# Patient Record
Sex: Male | Born: 1975 | Race: Black or African American | Hispanic: No | Marital: Married | Smoking: Former smoker
Health system: Southern US, Community
[De-identification: ages and names within clinical notes are randomized; demographics above are authoritative.]

## PROBLEM LIST (undated history)

## (undated) DIAGNOSIS — D696 Thrombocytopenia, unspecified: Secondary | ICD-10-CM

## (undated) DIAGNOSIS — F988 Other specified behavioral and emotional disorders with onset usually occurring in childhood and adolescence: Secondary | ICD-10-CM

## (undated) DIAGNOSIS — K219 Gastro-esophageal reflux disease without esophagitis: Secondary | ICD-10-CM

## (undated) DIAGNOSIS — E559 Vitamin D deficiency, unspecified: Secondary | ICD-10-CM

## (undated) DIAGNOSIS — I10 Essential (primary) hypertension: Secondary | ICD-10-CM

## (undated) HISTORY — DX: Gastro-esophageal reflux disease without esophagitis: K21.9

## (undated) HISTORY — DX: Essential (primary) hypertension: I10

## (undated) HISTORY — DX: Other specified behavioral and emotional disorders with onset usually occurring in childhood and adolescence: F98.8

## (undated) HISTORY — DX: Vitamin D deficiency, unspecified: E55.9

## (undated) HISTORY — PX: OTHER SURGICAL HISTORY: SHX169

---

## 1996-06-16 HISTORY — PX: KNEE ARTHROSCOPY: SUR90

## 2006-05-23 ENCOUNTER — Ambulatory Visit: Payer: Self-pay

## 2009-01-29 ENCOUNTER — Ambulatory Visit: Payer: Self-pay | Admitting: Sports Medicine

## 2013-10-02 ENCOUNTER — Emergency Department: Payer: Self-pay | Admitting: Emergency Medicine

## 2013-11-08 DIAGNOSIS — S62143A Displaced fracture of body of hamate [unciform] bone, unspecified wrist, initial encounter for closed fracture: Secondary | ICD-10-CM | POA: Insufficient documentation

## 2013-11-08 DIAGNOSIS — S62338A Displaced fracture of neck of other metacarpal bone, initial encounter for closed fracture: Secondary | ICD-10-CM | POA: Insufficient documentation

## 2013-11-08 DIAGNOSIS — S63096A Other dislocation of unspecified wrist and hand, initial encounter: Secondary | ICD-10-CM | POA: Insufficient documentation

## 2013-11-08 DIAGNOSIS — S62341A Nondisplaced fracture of base of second metacarpal bone. left hand, initial encounter for closed fracture: Secondary | ICD-10-CM | POA: Insufficient documentation

## 2014-09-09 IMAGING — CR RIGHT HAND - COMPLETE 3+ VIEW
1 series · 4 of 4 positions shown · non-contrast
Comparison: None.

CLINICAL DATA: Pain after hitting a wall.  Hand is swollen

EXAM:
RIGHT HAND - COMPLETE 3+ VIEW

[Series 1: oblique · 0.17mm/px · 4 of 4 slices shown]
[im 1/4]
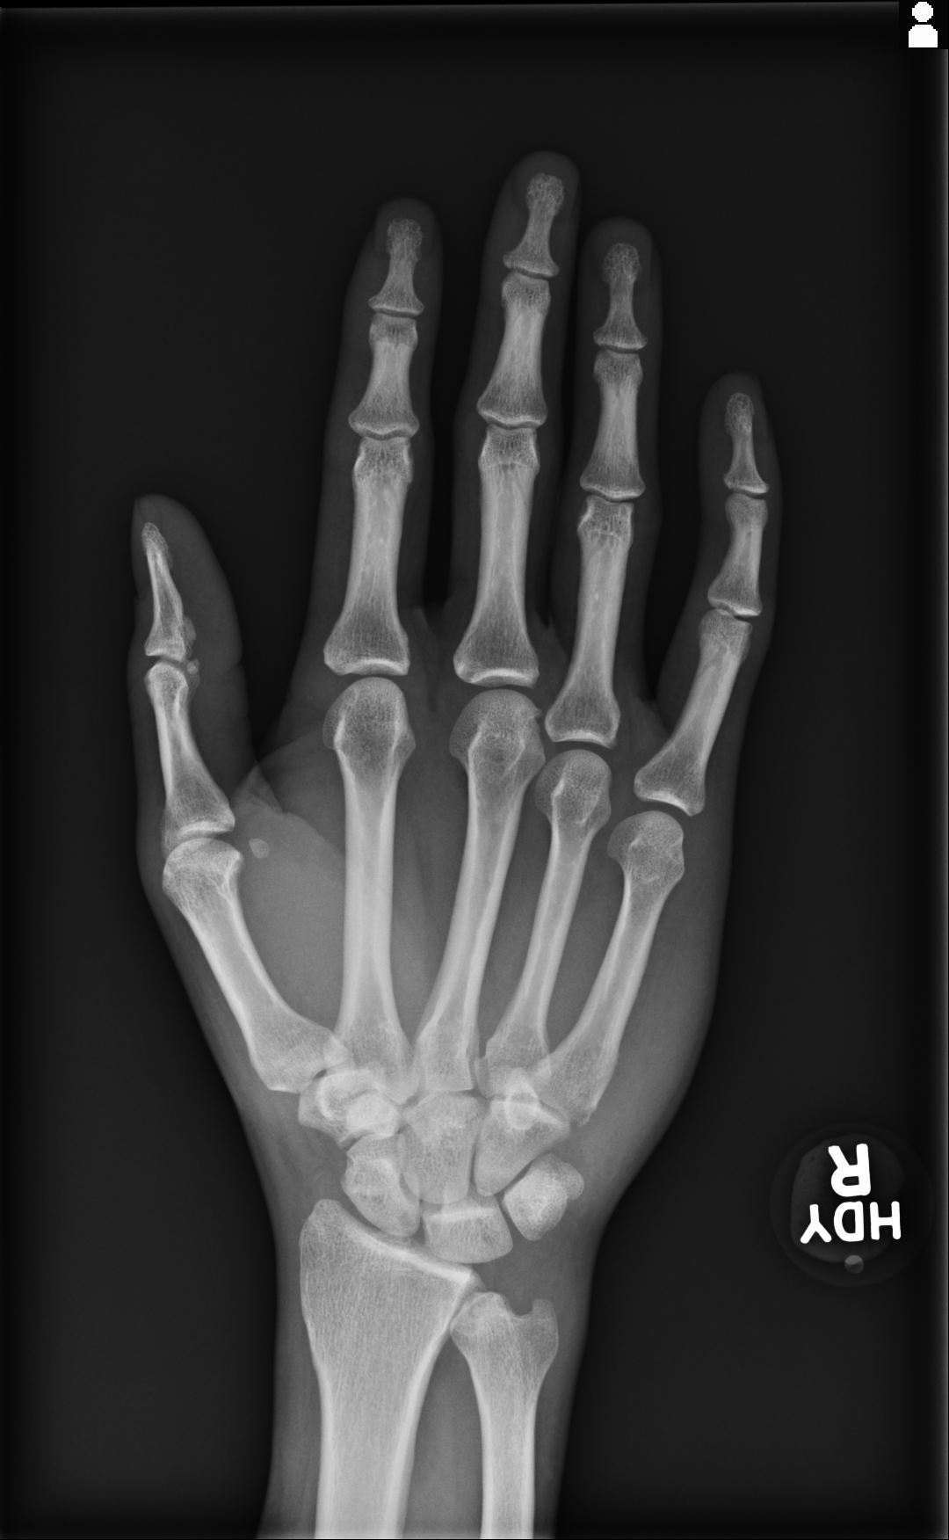
[im 2/4]
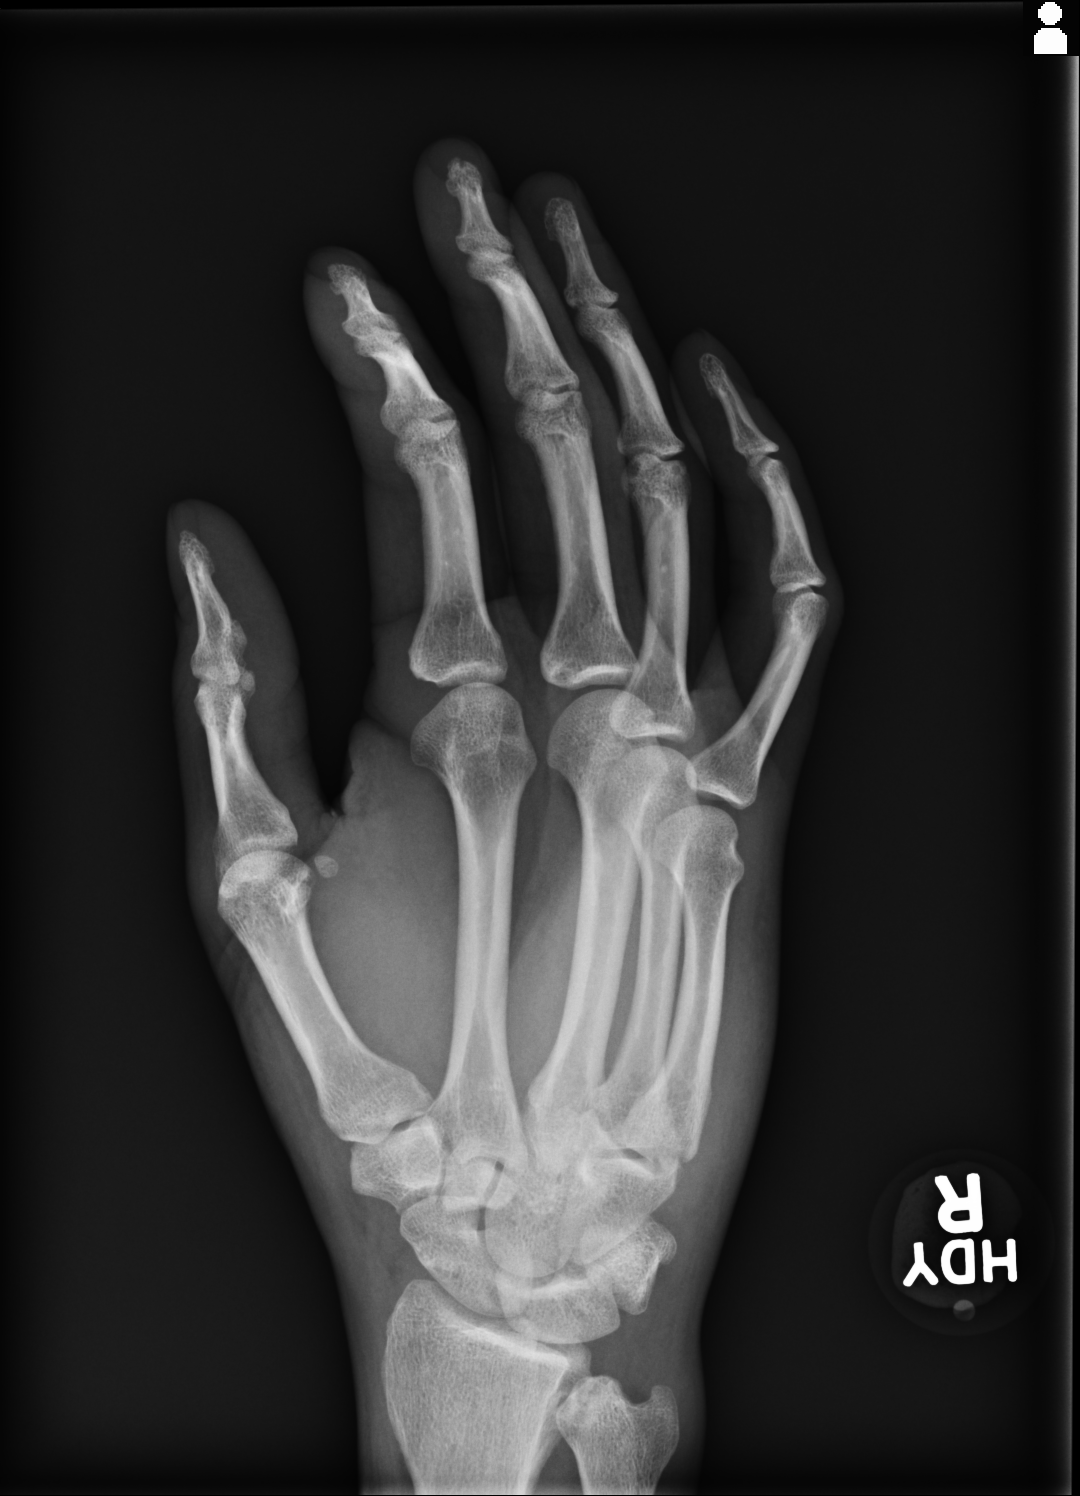
[im 3/4]
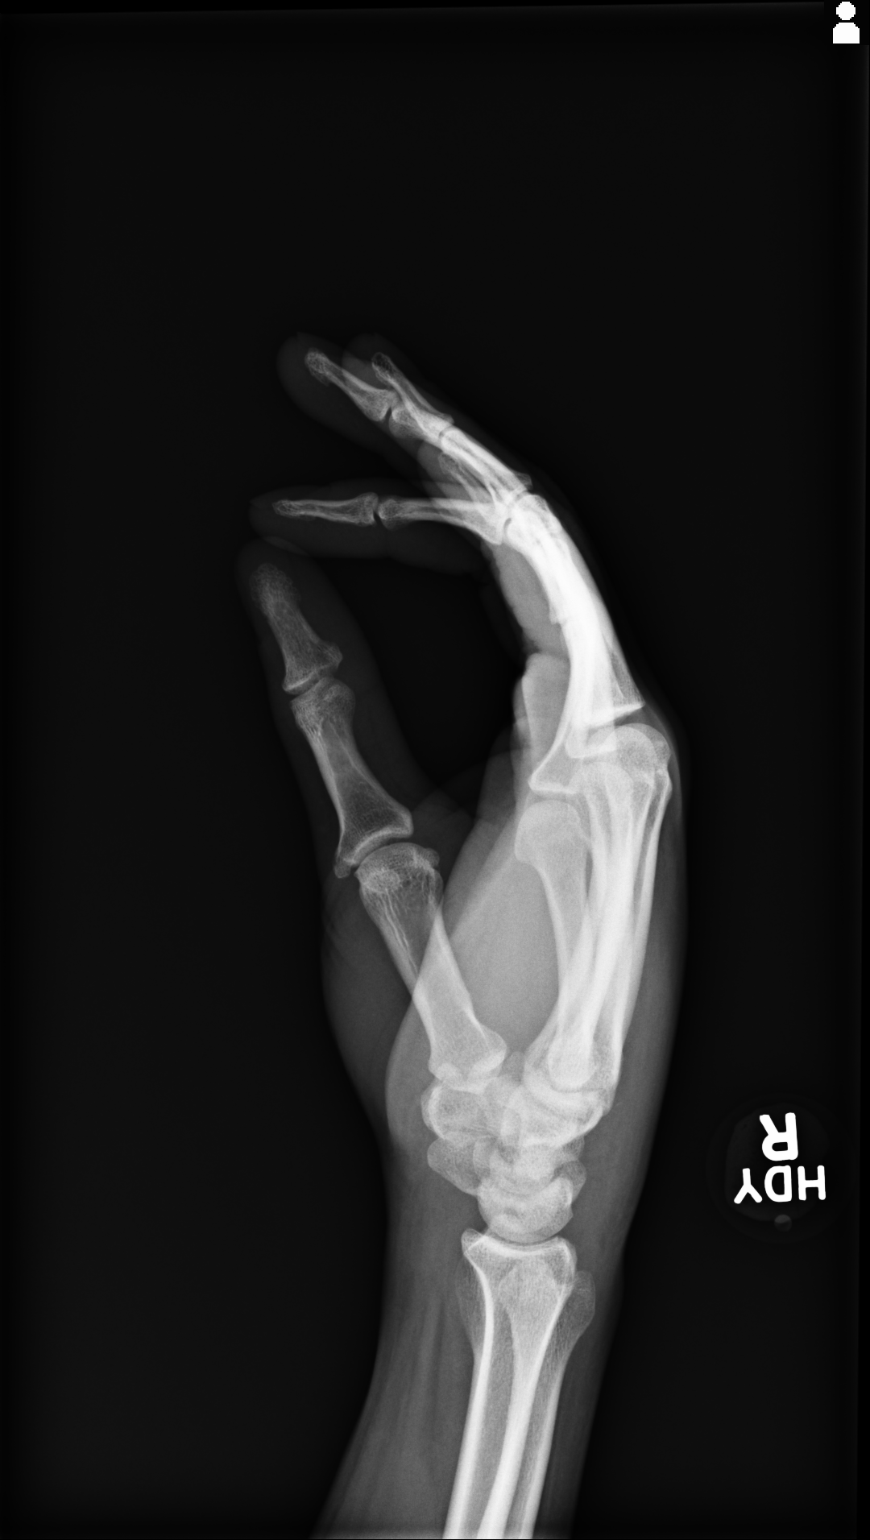
[im 4/4]
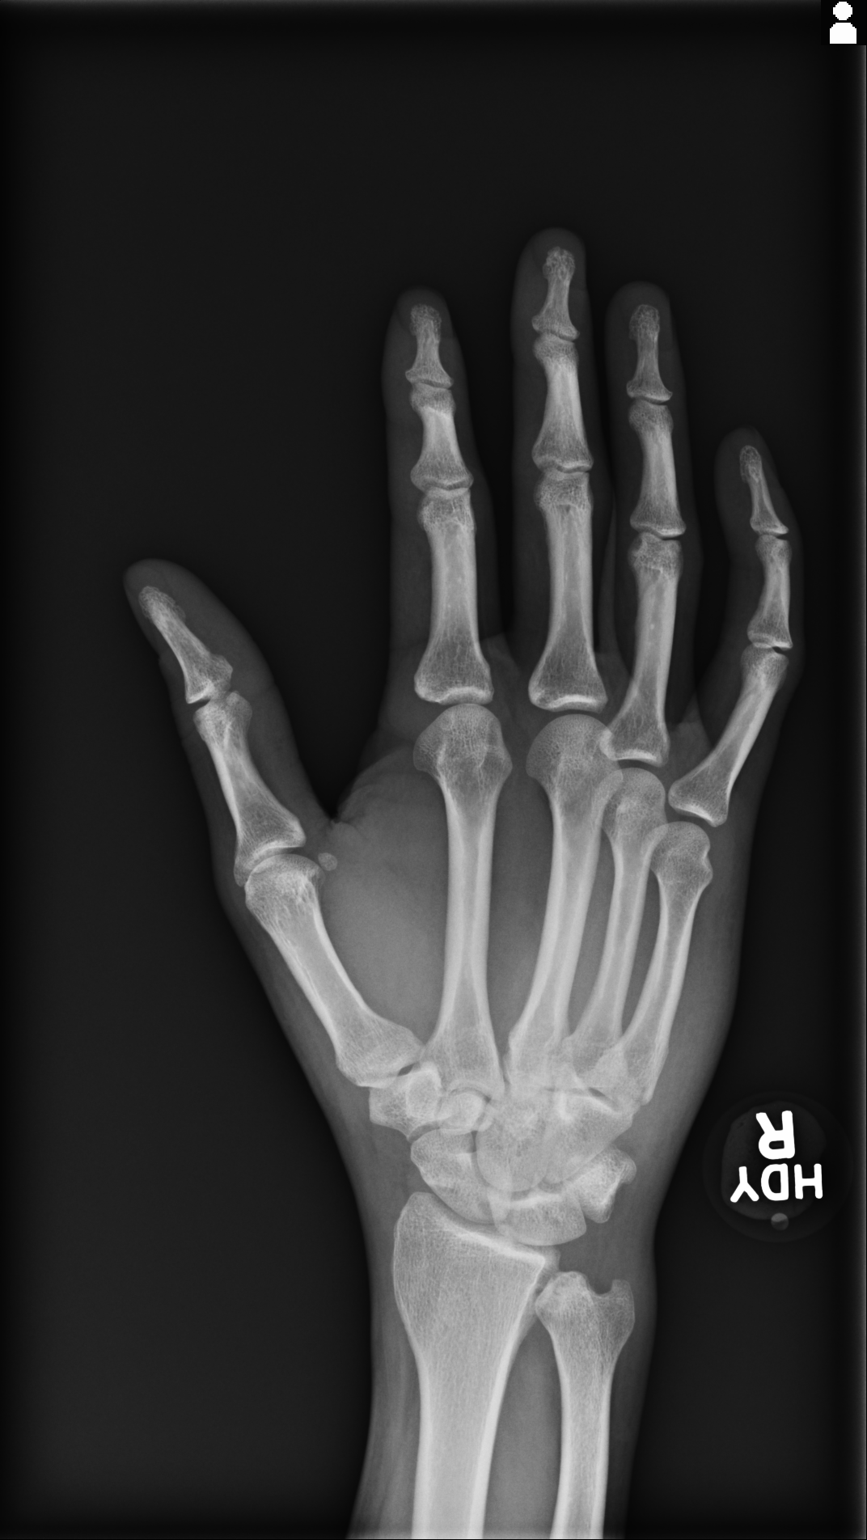

[4 of 4 positions shown; findings below may reference images not displayed]

FINDINGS: Suspect small non-displaced fracture at the base of the fifth
metacarpal near the carpometacarpal articulation. Soft tissue
swelling.
IMPRESSION: Suspect small nondisplaced fracture base of fifth MC.

## 2015-11-27 ENCOUNTER — Ambulatory Visit (INDEPENDENT_AMBULATORY_CARE_PROVIDER_SITE_OTHER): Payer: Managed Care, Other (non HMO) | Admitting: Family Medicine

## 2015-11-27 ENCOUNTER — Encounter: Payer: Self-pay | Admitting: Family Medicine

## 2015-11-27 VITALS — BP 128/78 | HR 81 | Temp 98.2°F | Resp 16 | Ht 74.0 in | Wt 217.8 lb

## 2015-11-27 DIAGNOSIS — K219 Gastro-esophageal reflux disease without esophagitis: Secondary | ICD-10-CM | POA: Diagnosis not present

## 2015-11-27 DIAGNOSIS — I10 Essential (primary) hypertension: Secondary | ICD-10-CM | POA: Insufficient documentation

## 2015-11-27 DIAGNOSIS — F988 Other specified behavioral and emotional disorders with onset usually occurring in childhood and adolescence: Secondary | ICD-10-CM | POA: Insufficient documentation

## 2015-11-27 DIAGNOSIS — D696 Thrombocytopenia, unspecified: Secondary | ICD-10-CM | POA: Diagnosis not present

## 2015-11-27 DIAGNOSIS — E559 Vitamin D deficiency, unspecified: Secondary | ICD-10-CM

## 2015-11-27 DIAGNOSIS — Z79899 Other long term (current) drug therapy: Secondary | ICD-10-CM

## 2015-11-27 DIAGNOSIS — M10072 Idiopathic gout, left ankle and foot: Secondary | ICD-10-CM | POA: Diagnosis not present

## 2015-11-27 DIAGNOSIS — Z23 Encounter for immunization: Secondary | ICD-10-CM | POA: Diagnosis not present

## 2015-11-27 DIAGNOSIS — Z13220 Encounter for screening for lipoid disorders: Secondary | ICD-10-CM | POA: Diagnosis not present

## 2015-11-27 DIAGNOSIS — K429 Umbilical hernia without obstruction or gangrene: Secondary | ICD-10-CM | POA: Insufficient documentation

## 2015-11-27 DIAGNOSIS — Z8679 Personal history of other diseases of the circulatory system: Secondary | ICD-10-CM | POA: Insufficient documentation

## 2015-11-27 DIAGNOSIS — Z8781 Personal history of (healed) traumatic fracture: Secondary | ICD-10-CM | POA: Insufficient documentation

## 2015-11-27 MED ORDER — ALLOPURINOL 100 MG PO TABS
100.0000 mg | ORAL_TABLET | Freq: Every day | ORAL | Status: DC
Start: 2015-11-27 — End: 2016-01-14

## 2015-11-27 MED ORDER — OMEPRAZOLE 20 MG PO CPDR: 20.0000 mg | DELAYED_RELEASE_CAPSULE | Freq: Every day | ORAL | Status: DC

## 2015-11-27 MED ORDER — COLCHICINE 0.6 MG PO TABS: 1.2000 mg | ORAL_TABLET | Freq: Every day | ORAL | Status: DC | PRN

## 2015-11-27 NOTE — Progress Notes (Signed)
Name: Victor Jackson   MRN: [id number]    DOB: 01/18/1976   Date:11/27/2015       Progress Note  Subjective  Chief Complaint  Chief Complaint  Patient presents with  . Gout    patient presents with left foot pain for about 2 or so weeks. sometime ago he was told by his podiatrist that he has gout   . Immunizations    Tdap    HPI  Gout: he had one similar episode last Fall and went to [place]iatrist in [place]. He was given an steroid injection and symptoms resolved. He has tried his changing his diet. He states that pain is on the same site ( left ankle ), started gradually over the past couple of days, associated with swelling, no redness or increase in warmth, tender to touch - but not as sensitive as the first episode. He has been taking Aleve otc - symptoms are not getting any worse but not improving either.   Thrombocytopenia: patient has not been seen in over 5 years and no labs done since  GERD: taking Omeprazole otc daily, discussed possible long term risk and the need to try weaning self off, and replace with Ranitidine prn .   History of HTN: bp is at goal today, not on medications for a long time, denies chest pain or palpitation    Patient Active Problem List   Diagnosis Date Noted  . Umbilical hernia 11/27/2015  . GERD (gastroesophageal reflux disease) 11/27/2015  . Thrombocytopenia (HCC) 11/27/2015  . Vitamin D deficiency 11/27/2015  . History of hypertension 11/27/2015  . ADD (attention deficit disorder) 11/27/2015  . History of hand fracture     Past Surgical History  Procedure Laterality Date  . None      Family History  Problem Relation Age of Onset  . Arthritis Mother   . Diabetes Mother   . Hypertension Mother   . COPD Father   . Hypertension Father     Social History   Social History  . Marital Status: Single    Spouse Name: N/A  . Number of Children: N/A  . Years of Education: N/A   Occupational History  . Not on file.   Social History  Main Topics  . Smoking status: Former Smoker -- 0.00 packs/day for 1 years    Types: Cigars    Quit date: 06/28/2014  . Smokeless tobacco: Never Used  . Alcohol Use: 0.6 oz/week    0 Standard drinks or equivalent, 1 Cans of beer per week     Comment: rarely  . Drug Use: No  . Sexual Activity:    Partners: Female    Birth Control/ Protection: Other-see comments     Comment: girlfriend had tubal ligation    Other Topics Concern  . Not on file   Social History Narrative     Current outpatient prescriptions:  .  allopurinol (ZYLOPRIM) 100 MG tablet, Take 1 tablet (100 mg total) by mouth daily., Disp: 30 tablet, Rfl: 2 .  colchicine 0.6 MG tablet, Take 2 tablets (1.2 mg total) by mouth daily as needed. May repeat 0.6 mg  in 2 hours if no improvement, Disp: 30 tablet, Rfl: 0 .  omeprazole (PRILOSEC) 20 MG capsule, Take 1 capsule (20 mg total) by mouth daily., Disp: 30 capsule, Rfl: 3  No Known Allergies   ROS  Constitutional: Negative for fever , positive for  weight change.  Respiratory: Negative for cough and shortness of breath.  Cardiovascular: Negative for chest pain or palpitations.  Gastrointestinal: Negative for abdominal pain, no bowel changes.  Musculoskeletal: Negative for gait problem or joint swelling.  Skin: Negative for rash.  Neurological: Negative for dizziness or headache.  No other specific complaints in a complete review of systems (except as listed in HPI above).  Objective  Filed Vitals:   11/27/15 0954  BP: 128/78  Pulse: 81  Temp: 98.2 F (36.8 C)  TempSrc: Oral  Resp: 16  Height:  (1.88 m)  Weight: 217 lb 12.8 oz (98.793 kg)  SpO2: 99%    Body mass index is 27.95 kg/(m^2).  Physical Exam  Constitutional: Patient appears well-developed and well-nourished.  No distress.  HEENT: head atraumatic, normocephalic, pupils equal and reactive to light, neck supple, throat within normal limits Cardiovascular: Normal rate, regular rhythm and  normal heart sounds.  No murmur heard. No BLE edema. Pulmonary/Chest: Effort normal and breath sounds normal. No respiratory distress. Abdominal: Soft.  There is no tenderness. Psychiatric: Patient has a normal mood and affect. behavior is normal. Judgment and thought content normal. Muscular Skeletal: swelling and increase in warmth on lateral aspect of left foot, tenderness with palpation, pain with rom ( had x-ray done by podiatrist )  PHQ2/9: Depression screen PHQ 2/9 11/27/2015  Decreased Interest 0  Down, Depressed, Hopeless 0  PHQ - 2 Score 0    Fall Risk: Fall Risk  11/27/2015  Falls in the past year? No    Functional Status Survey: Is the patient deaf or have difficulty hearing?: No Does the patient have difficulty seeing, even when wearing glasses/contacts?: No Does the patient have difficulty concentrating, remembering, or making decisions?: No Does the patient have difficulty walking or climbing stairs?: No Does the patient have difficulty dressing or bathing?: No Does the patient have difficulty doing errands alone such as visiting a doctor's office or shopping?: No    Assessment & Plan  1. Acute idiopathic gout of left foot  Possible cause of his pain, not as sensitive to touch as expected, we will try medication and if no improvement change to prednisone, and refer to Podiatrist - Uric acid - colchicine 0.6 MG tablet; Take 2 tablets (1.2 mg total) by mouth daily as needed. May repeat 0.6 mg  in 2 hours if no improvement  Dispense: 30 tablet; Refill: 0 - allopurinol (ZYLOPRIM) 100 MG tablet; Take 1 tablet (100 mg total) by mouth daily.  Dispense: 30 tablet; Refill: 2  2. Gastroesophageal reflux disease without esophagitis  - omeprazole (PRILOSEC) 20 MG capsule; Take 1 capsule (20 mg total) by mouth daily.  Dispense: 30 capsule; Refill: 3  3. Thrombocytopenia (HCC)  - CBC with Differential/Platelet  4. Lipid screening  - Lipid panel  5. Vitamin D  deficiency  - VITAMIN D 25 Hydroxy (Vit-D Deficiency, Fractures)  6. Long-term use of high-risk medication  - Comprehensive metabolic panel  7. Need for Tdap vaccination  - Tdap vaccine greater than or equal to 7yo IM

## 2016-01-14 ENCOUNTER — Other Ambulatory Visit: Payer: Self-pay

## 2016-01-14 DIAGNOSIS — M10072 Idiopathic gout, left ankle and foot: Secondary | ICD-10-CM

## 2016-01-14 MED ORDER — ALLOPURINOL 100 MG PO TABS: 100.0000 mg | ORAL_TABLET | Freq: Every day | ORAL | 1 refills | Status: DC

## 2016-01-14 NOTE — Telephone Encounter (Signed)
Patient requesting refill. 

## 2016-01-15 ENCOUNTER — Encounter: Payer: Self-pay | Admitting: Family Medicine

## 2016-03-12 ENCOUNTER — Encounter: Payer: Self-pay | Admitting: Family Medicine

## 2016-04-17 ENCOUNTER — Telehealth: Payer: Self-pay | Admitting: Family Medicine

## 2016-04-17 NOTE — Telephone Encounter (Signed)
Pt called requesting refill on Allopurinol to be sent to [facility].

## 2016-04-18 ENCOUNTER — Other Ambulatory Visit: Payer: Self-pay | Admitting: Family Medicine

## 2016-04-18 DIAGNOSIS — M10072 Idiopathic gout, left ankle and foot: Secondary | ICD-10-CM

## 2016-04-18 MED ORDER — ALLOPURINOL 100 MG PO TABS: 100.0000 mg | ORAL_TABLET | Freq: Every day | ORAL | 1 refills | Status: DC

## 2016-05-02 ENCOUNTER — Encounter: Payer: Self-pay | Admitting: Family Medicine

## 2016-05-02 ENCOUNTER — Ambulatory Visit (INDEPENDENT_AMBULATORY_CARE_PROVIDER_SITE_OTHER): Payer: Self-pay | Admitting: Family Medicine

## 2016-05-02 VITALS — BP 132/82 | HR 80 | Temp 98.3°F | Resp 16 | Ht 74.3 in | Wt 202.1 lb

## 2016-05-02 DIAGNOSIS — Z23 Encounter for immunization: Secondary | ICD-10-CM

## 2016-05-02 DIAGNOSIS — Z13220 Encounter for screening for lipoid disorders: Secondary | ICD-10-CM

## 2016-05-02 DIAGNOSIS — D696 Thrombocytopenia, unspecified: Secondary | ICD-10-CM

## 2016-05-02 DIAGNOSIS — E559 Vitamin D deficiency, unspecified: Secondary | ICD-10-CM

## 2016-05-02 DIAGNOSIS — Z131 Encounter for screening for diabetes mellitus: Secondary | ICD-10-CM

## 2016-05-02 DIAGNOSIS — Z0000 Encounter for general adult medical examination without abnormal findings: Secondary | ICD-10-CM

## 2016-05-02 DIAGNOSIS — M109 Gout, unspecified: Secondary | ICD-10-CM

## 2016-05-02 DIAGNOSIS — M25572 Pain in left ankle and joints of left foot: Secondary | ICD-10-CM

## 2016-05-02 NOTE — Progress Notes (Signed)
Name: Victor Jackson   MRN: [id number]    DOB: 11/15/1975   Date:05/02/2016       Progress Note  Subjective  Chief Complaint  Chief Complaint  Patient presents with  . Annual Exam    HPI  Well male: lives with the mother of his 3 younger children, also has a teenage son from previous relationship. He changed his diet and lost 16 lbs since last visit. He is still active, playing basketball with friends about once a week and is active at work. No ED. No recent episodes of gout.   Left ankle pain: he has noticed pain on his left ankle for about one year, no redness or swelling, over the past couple of weeks it is causing him to limp, pain is on on medial and lateral ankle, no recent injuries. No increase in warmth or pain when applying pressure, only when walking  Patient Active Problem List   Diagnosis Date Noted  . Controlled gout 05/02/2016  . Umbilical hernia 11/27/2015  . GERD (gastroesophageal reflux disease) 11/27/2015  . Thrombocytopenia (HCC) 11/27/2015  . Vitamin D deficiency 11/27/2015  . History of hypertension 11/27/2015  . ADD (attention deficit disorder) 11/27/2015  . History of hand fracture     Past Surgical History:  Procedure Laterality Date  . none      Family History  Problem Relation Age of Onset  . Arthritis Mother   . Diabetes Mother   . Hypertension Mother   . COPD Father   . Hypertension Father     Social History   Social History  . Marital status: Single    Spouse name: [place]  . Number of children: N/A  . Years of education: N/A   Occupational History  . Not on file.   Social History Main Topics  . Smoking status: Former Smoker    Packs/day: 0.00    Years: 1.00    Types: Cigars    Quit date: 06/28/2014  . Smokeless tobacco: Never Used  . Alcohol use 0.6 oz/week    1 Cans of beer per week     Comment: rarely  . Drug use: No  . Sexual activity: Yes    Partners: Female    Birth control/ protection: Other-see comments     Comment:  girlfriend had tubal ligation    Other Topics Concern  . Not on file   Social History Narrative   Lives with girlfriend [name]( [place] ) and their 3 children.    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         Current Outpatient Prescriptions:  .  allopurinol (ZYLOPRIM) 100 MG tablet, Take 1 tablet (100 mg total) by mouth daily., Disp: 30 tablet, Rfl: 1 .  colchicine 0.6 MG tablet, Take 2 tablets (1.2 mg total) by mouth daily as needed. May repeat 0.6 mg  in 2 hours if no improvement, Disp: 30 tablet, Rfl: 0  No Known Allergies   ROS  Constitutional: Negative for fever, positive for weight change.  Respiratory: Negative for cough and shortness of breath.   Cardiovascular: Negative for chest pain or palpitations.  Gastrointestinal: Negative for abdominal pain, no bowel changes.  Musculoskeletal: Negative for gait problem or joint swelling. He has left ankle pain  Skin: Negative for rash.  Neurological: Negative for dizziness or headache.  No other specific complaints in a complete review of systems (except as listed in HPI above).  Objective  Vitals:   05/02/16 1118  BP: 132/82  Pulse: 80  Resp: 16  Temp: 98.3 F (36.8 C)  TempSrc: Oral  SpO2: 97%  Weight: 202 lb 1 oz (91.7 kg)  Height: 6' 2.3" (1.887 m)    Body mass index is 25.73 kg/m.  Physical Exam  Constitutional: Patient appears well-developed and well-nourished. No distress.  HENT: Head: Normocephalic and atraumatic. Ears: B TMs ok, no erythema or effusion; Nose: Nose normal. Mouth/Throat: Oropharynx is clear and moist. No oropharyngeal exudate.  Eyes: Conjunctivae and EOM are normal. Pupils are equal, round, and reactive to light. No scleral icterus.  Neck: Normal range of motion. Neck supple. No JVD present. No thyromegaly present.  Cardiovascular: Normal rate, regular rhythm and normal heart sounds.  No murmur heard. No BLE edema. Pulmonary/Chest: Effort normal and breath sounds normal. No  respiratory distress. Abdominal: Soft. Bowel sounds are normal, no distension. There is no tenderness. no masses MALE GENITALIA: Normal descended testes bilaterally, no masses palpated, no hernias, no lesions, no discharge RECTAL: Prostate normal size and consistency, no rectal masses or hemorrhoids Musculoskeletal: Normal range of motion, no joint effusions. No gross deformities, flat feet Neurological: he is alert and oriented to person, place, and time. No cranial nerve deficit. Coordination, balance, strength, speech and gait are normal.  Skin: Skin is warm and dry. No rash noted. No erythema.  Psychiatric: Patient has a normal mood and affect. behavior is normal. Judgment and thought content normal.   PHQ2/9: Depression screen [facility] 2/9 05/02/2016 11/27/2015  Decreased Interest 0 0  Down, Depressed, Hopeless 0 0  PHQ - 2 Score 0 0    Fall Risk: Fall Risk  05/02/2016 11/27/2015  Falls in the past year? No No     Functional Status Survey: Is the patient deaf or have difficulty hearing?: No Does the patient have difficulty seeing, even when wearing glasses/contacts?: No Does the patient have difficulty concentrating, remembering, or making decisions?: No Does the patient have difficulty walking or climbing stairs?: No Does the patient have difficulty dressing or bathing?: No Does the patient have difficulty doing errands alone such as visiting a doctor's office or shopping?: No   Assessment & Plan  1. Encounter for routine history and physical exam for male  Discussed importance of 150 minutes of physical activity weekly, eat two servings of fish weekly, eat one serving of tree nuts ( cashews, pistachios, pecans, almonds.Marland Kitchen.) every other day, eat 6 servings of fruit/vegetables daily and drink plenty of water and avoid sweet beverages.  - CBC with Differential/Platelet - COMPLETE METABOLIC PANEL WITH GFR - Hemoglobin A1c - VITAMIN D 25 Hydroxy (Vit-D Deficiency, Fractures) - Lipid  panel  2. Lipid screening  - Lipid panel  3. Vitamin D deficiency  - VITAMIN D 25 Hydroxy (Vit-D Deficiency, Fractures)  4. Thrombocytopenia (HCC)  - CBC with Differential/Platelet  5. Controlled gout  - Uric acid  6. Diabetes mellitus screening  - Hemoglobin A1c  7. Needs flu shot  Refused    8. Left ankle pain, unspecified chronicity  - Ambulatory referral to Podiatry

## 2016-05-26 ENCOUNTER — Ambulatory Visit: Payer: Self-pay

## 2016-05-26 ENCOUNTER — Ambulatory Visit (INDEPENDENT_AMBULATORY_CARE_PROVIDER_SITE_OTHER): Payer: Self-pay | Admitting: Podiatry

## 2016-05-26 ENCOUNTER — Ambulatory Visit (INDEPENDENT_AMBULATORY_CARE_PROVIDER_SITE_OTHER): Payer: Self-pay

## 2016-05-26 VITALS — BP 135/84 | HR 56 | Temp 98.2°F | Resp 16

## 2016-05-26 DIAGNOSIS — M2142 Flat foot [pes planus] (acquired), left foot: Secondary | ICD-10-CM

## 2016-05-26 DIAGNOSIS — R52 Pain, unspecified: Secondary | ICD-10-CM

## 2016-05-26 DIAGNOSIS — M76822 Posterior tibial tendinitis, left leg: Secondary | ICD-10-CM

## 2016-05-26 DIAGNOSIS — Q6650 Congenital pes planus, unspecified foot: Secondary | ICD-10-CM

## 2016-05-26 NOTE — Progress Notes (Signed)
   Subjective:    Patient ID: Victor Jackson, male    DOB: 11/11/1975, 40 y.o.   MRN: [id number]  HPI: He presents today with a chief complaint of pain to his left foot and ankle 1 year gradually seems to be getting worse. He saw Dr. Elijah Birkom in the past 2 provided him with a pair of orthotics which really hasn't helped much at all. He takes Aleve with some relief.    Review of Systems  All other systems reviewed and are negative.      Objective:   Physical Exam vital signs are stable he is alert and oriented 3 pulses are palpable. Neurologic sensorium is intact. Deep tendon reflexes are intact. Muscle strength intact bilateral. Orthopedic evaluation of his result assistant ankle range of motion or crepitation. Limited subtalar joint range of motion with stiffness left greater than that of the right. Radiographs taken today do demonstrate what appears to be a normal ankle with some osteophytic spurring dorsal aspect of the talus however this should not be limiting his range of motion. Some minor sclerotic changes subtalar joint posterior aspect left with severe pes planus. No open lesions or wounds.        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  Plan: We will try to get him into a pair of orthotics particularly since his heel can reach neutral. He was scanned today before he left.

## 2016-06-25 ENCOUNTER — Ambulatory Visit (INDEPENDENT_AMBULATORY_CARE_PROVIDER_SITE_OTHER): Payer: Self-pay | Admitting: Podiatry

## 2016-06-25 ENCOUNTER — Encounter: Payer: Self-pay | Admitting: Podiatry

## 2016-06-25 DIAGNOSIS — M76822 Posterior tibial tendinitis, left leg: Secondary | ICD-10-CM

## 2016-06-25 DIAGNOSIS — M2142 Flat foot [pes planus] (acquired), left foot: Secondary | ICD-10-CM

## 2016-06-25 NOTE — Progress Notes (Signed)
Dispensed patient's orthotics with oral and written instructions for wearing. Patient will follow up with Dr. Hyatt in 1 month for an orthotic check. 

## 2016-06-25 NOTE — Patient Instructions (Signed)

## 2016-07-18 ENCOUNTER — Encounter: Payer: Managed Care, Other (non HMO) | Admitting: Family Medicine

## 2016-07-28 ENCOUNTER — Ambulatory Visit (INDEPENDENT_AMBULATORY_CARE_PROVIDER_SITE_OTHER): Payer: BLUE CROSS/BLUE SHIELD | Admitting: Podiatry

## 2016-07-28 ENCOUNTER — Encounter: Payer: Self-pay | Admitting: Podiatry

## 2016-07-28 DIAGNOSIS — M2142 Flat foot [pes planus] (acquired), left foot: Secondary | ICD-10-CM | POA: Diagnosis not present

## 2016-07-28 DIAGNOSIS — Q6650 Congenital pes planus, unspecified foot: Secondary | ICD-10-CM | POA: Diagnosis not present

## 2016-07-28 DIAGNOSIS — M76822 Posterior tibial tendinitis, left leg: Secondary | ICD-10-CM

## 2016-07-28 NOTE — Progress Notes (Signed)
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 He states that the orthotics seem to do well in most shoes but don't do very well his work shoes. He states that his work shoes or just a standard pair of tennis shoes.  Objective: I'll signs are stable he is alert and oriented 3. No change in physical exam.  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  Plan: Continue use of the orthotics for another 3 months I recommended that he consider purchasing another pair of orthotics will fit into the greater number shoes with no heel post. He would like to consider this if his insurance pain is otherwise I'll follow-up with him in 3 months

## 2016-09-01 ENCOUNTER — Ambulatory Visit: Payer: Self-pay | Admitting: Family Medicine

## 2016-09-11 ENCOUNTER — Encounter: Payer: Self-pay | Admitting: Family Medicine

## 2016-09-11 ENCOUNTER — Ambulatory Visit (INDEPENDENT_AMBULATORY_CARE_PROVIDER_SITE_OTHER): Payer: BLUE CROSS/BLUE SHIELD | Admitting: Family Medicine

## 2016-09-11 ENCOUNTER — Other Ambulatory Visit: Payer: Self-pay | Admitting: Family Medicine

## 2016-09-11 VITALS — BP 124/84 | HR 78 | Temp 98.2°F | Resp 16 | Ht 74.0 in | Wt 209.1 lb

## 2016-09-11 DIAGNOSIS — M7752 Other enthesopathy of left foot: Secondary | ICD-10-CM

## 2016-09-11 DIAGNOSIS — R002 Palpitations: Secondary | ICD-10-CM

## 2016-09-11 DIAGNOSIS — D696 Thrombocytopenia, unspecified: Secondary | ICD-10-CM | POA: Diagnosis not present

## 2016-09-11 DIAGNOSIS — F988 Other specified behavioral and emotional disorders with onset usually occurring in childhood and adolescence: Secondary | ICD-10-CM | POA: Diagnosis not present

## 2016-09-11 DIAGNOSIS — K219 Gastro-esophageal reflux disease without esophagitis: Secondary | ICD-10-CM | POA: Diagnosis not present

## 2016-09-11 DIAGNOSIS — Z0000 Encounter for general adult medical examination without abnormal findings: Secondary | ICD-10-CM | POA: Diagnosis not present

## 2016-09-11 DIAGNOSIS — E559 Vitamin D deficiency, unspecified: Secondary | ICD-10-CM

## 2016-09-11 DIAGNOSIS — M2142 Flat foot [pes planus] (acquired), left foot: Secondary | ICD-10-CM | POA: Diagnosis not present

## 2016-09-11 DIAGNOSIS — M109 Gout, unspecified: Secondary | ICD-10-CM

## 2016-09-11 DIAGNOSIS — M2141 Flat foot [pes planus] (acquired), right foot: Secondary | ICD-10-CM | POA: Diagnosis not present

## 2016-09-11 DIAGNOSIS — M778 Other enthesopathies, not elsewhere classified: Secondary | ICD-10-CM

## 2016-09-11 DIAGNOSIS — M779 Enthesopathy, unspecified: Secondary | ICD-10-CM

## 2016-09-11 DIAGNOSIS — M2140 Flat foot [pes planus] (acquired), unspecified foot: Secondary | ICD-10-CM | POA: Insufficient documentation

## 2016-09-11 LAB — TSH: TSH: 0.71 m[IU]/L (ref 0.40–4.50)

## 2016-09-11 LAB — LIPID PANEL
CHOL/HDL RATIO: 2.7 ratio (ref <5.0)
Cholesterol: 149 mg/dL (ref <200)
HDL: 55 mg/dL (ref >40)
LDL Cholesterol: 79 mg/dL (ref <100)
Triglycerides: 74 mg/dL (ref <150)
VLDL: 15 mg/dL (ref <30)

## 2016-09-11 LAB — COMPLETE METABOLIC PANEL WITH GFR
ALT: 18 U/L (ref 9–46)
AST: 17 U/L (ref 10–40)
Albumin: 4.1 g/dL (ref 3.6–5.1)
Alkaline Phosphatase: 49 U/L (ref 40–115)
BUN: 12 mg/dL (ref 7–25)
CO2: 21 mmol/L (ref 20–31)
Calcium: 9.4 mg/dL (ref 8.6–10.3)
Chloride: 107 mmol/L (ref 98–110)
Creat: 1.15 mg/dL (ref 0.60–1.35)
GFR, EST NON AFRICAN AMERICAN: 78 mL/min (ref >=60)
GFR, Est African American: >89 mL/min (ref >=60)
GLUCOSE: 88 mg/dL (ref 65–99)
POTASSIUM: 4 mmol/L (ref 3.5–5.3)
SODIUM: 143 mmol/L (ref 135–146)
TOTAL PROTEIN: 7 g/dL (ref 6.1–8.1)
Total Bilirubin: 1.2 mg/dL (ref 0.2–1.2)

## 2016-09-11 LAB — CBC WITH DIFFERENTIAL/PLATELET
BASOS ABS: 0 {cells}/uL (ref 0–200)
Basophils Relative: 0 %
EOS ABS: 57 {cells}/uL (ref 15–500)
Eosinophils Relative: 1 %
HEMATOCRIT: 43.4 % (ref 38.5–50.0)
HEMOGLOBIN: 14.5 g/dL (ref 13.2–17.1)
LYMPHS ABS: 1824 {cells}/uL (ref 850–3900)
Lymphocytes Relative: 32 %
MCH: 29.4 pg (ref 27.0–33.0)
MCHC: 33.4 g/dL (ref 32.0–36.0)
MCV: 87.9 fL (ref 80.0–100.0)
MPV: 12.2 fL (ref 7.5–12.5)
Monocytes Absolute: 456 cells/uL (ref 200–950)
Monocytes Relative: 8 %
NEUTROS ABS: 3363 {cells}/uL (ref 1500–7800)
Neutrophils Relative %: 59 %
Platelets: 109 10*3/uL — ABNORMAL LOW (ref 140–400)
RBC: 4.94 MIL/uL (ref 4.20–5.80)
RDW: 14.5 % (ref 11.0–15.0)
WBC: 5.7 10*3/uL (ref 3.8–10.8)

## 2016-09-11 LAB — URIC ACID: URIC ACID, SERUM: 8.4 mg/dL — AB (ref 4.0–8.0)

## 2016-09-11 MED ORDER — LISDEXAMFETAMINE DIMESYLATE 40 MG PO CAPS: 40.0000 mg | ORAL_CAPSULE | ORAL | 0 refills | Status: DC

## 2016-09-11 NOTE — Progress Notes (Signed)
Name: Victor Jackson   MRN: [id number]    DOB: [birth year]   Date:09/11/2016       Progress Note  Subjective  Chief Complaint  Chief Complaint  Patient presents with  . Gout    4 month follow up no recent flare ups or issues  . ADD    no issues  . Gastroesophageal Reflux    no issues    HPI  Gout: he had two episodes of acute onset of left ankle pain, swelling but no redness. We will check uric acid, no recent episodes. Never started Allopurinol  ADHD: son has ADHD, he was diagnosed at the time that his son was diagnosed with the condition. He responded well to Adderal in the past, but had a gap in insurance and has been off medication for a long time. He states that when off medication he feels overwhelmed, changes tasks and has difficulty prioritizing. He states it takes him longer than it should.   Thrombocytopenia: patient has not been seen in over 5 years and no labs done since, he will have it done today  Palpitation: symptoms happened about 1 month ago, it was in the morning after he woke up, not associated with SOB or chest pain, but felt hot all over. Resolved by itself. Advised to come over or call EMS for EKG tracing if it happens again. This time it lasted 30 minutes  GERD: he has weaned self off Omeprazole and is only having symptoms when he splurges with fried or spicy food. No need to take any medication recently   History of HTN: bp is at goal today, not on medications for a long time, denies chest pain or palpitation BP was up while on Adderal for ADHD   Patient Active Problem List   Diagnosis Date Noted  . Pes planus 09/11/2016  . Capsulitis of left foot 09/11/2016  . Controlled gout 05/02/2016  . Umbilical hernia 11/27/2015  . GERD (gastroesophageal reflux disease) 11/27/2015  . Thrombocytopenia (HCC) 11/27/2015  . Vitamin D deficiency 11/27/2015  . History of hypertension 11/27/2015  . ADD (attention deficit disorder) 11/27/2015  . History of hand fracture      Past Surgical History:  Procedure Laterality Date  . none      Family History  Problem Relation Age of Onset  . Arthritis Mother   . Diabetes Mother   . Hypertension Mother   . COPD Father   . Hypertension Father     Social History   Social History  . Marital status: Single    Spouse name: [name]  . Number of children: N/A  . Years of education: N/A   Occupational History  . Not on file.   Social History Main Topics  . Smoking status: Former Smoker    Packs/day: 0.00    Years: 1.00    Types: Cigars    Quit date: 06/28/2014  . Smokeless tobacco: Never Used  . Alcohol use 0.6 oz/week    1 Cans of beer per week     Comment: rarely  . Drug use: No  . Sexual activity: Yes    Partners: Female    Birth control/ protection: Other-see comments     Comment: girlfriend had tubal ligation    Other Topics Concern  . Not on file   Social History Narrative   Lives with girlfriend [name] ) and their 3 children.    Working at [employer], Heritage manager.  No current outpatient prescriptions on file.  No Known Allergies   ROS  Constitutional: Negative for fever, positive for  weight change.  Respiratory: Negative for cough and shortness of breath.   Cardiovascular: Negative for chest pain , one episode of  Palpitation about one month ago - lasted about 30 minutes.  Gastrointestinal: Negative for abdominal pain, no bowel changes.  Musculoskeletal: Negative for gait problem or joint swelling.  Skin: Negative for rash.  Neurological: Negative for dizziness or headache.  No other specific complaints in a complete review of systems (except as listed in HPI above).  Objective  Vitals:   09/11/16 0850  BP: 124/84  Pulse: 78  Resp: 16  Temp: 98.2 F (36.8 C)  SpO2: 98%  Weight: 209 lb 1 oz (94.8 kg)  Height: 6\' 2"  (1.88 m)    Body mass index is 26.84 kg/m.  Physical Exam  Constitutional: Patient appears well-developed and  well-nourished. Overweight. No  distress.  HEENT: head atraumatic, normocephalic, pupils equal and reactive to light, neck supple, throat within normal limits Cardiovascular: Normal rate, regular rhythm and normal heart sounds.  No murmur heard. No BLE edema. Pulmonary/Chest: Effort normal and breath sounds normal. No respiratory distress. Abdominal: Soft.  There is no tenderness. Psychiatric: Patient has a normal mood and affect. behavior is normal. Judgment and thought content normal.  PHQ2/9: [facility] screen [facility] 09/11/2016 05/02/2016 11/27/2015  Decreased Interest 0 0 0  Down, Depressed, Hopeless 0 0 0  PHQ - 2 Score 0 0 0    Fall Risk: Fall Risk  09/11/2016 05/02/2016 11/27/2015  Falls in the past year? No No No    Functional Status Survey: Is the patient deaf or have difficulty hearing?: No Does the patient have difficulty seeing, even when wearing glasses/contacts?: No Does the patient have difficulty concentrating, remembering, or making decisions?: No Does the patient have difficulty walking or climbing stairs?: No Does the patient have difficulty dressing or bathing?: No Does the patient have difficulty doing errands alone such as visiting a doctor's office or shopping?: No    Assessment & Plan  1. Gastroesophageal reflux disease without esophagitis  Doing well at this time, triggered by his diet  2. Attention deficit disorder (ADD) without hyperactivity  Discussed possible side effects, including elevation of bp , it happened in the past with Adderal. Follow up in one month - lisdexamfetamine (VYVANSE) 40 MG capsule; Take 1 capsule (40 mg total) by mouth every morning.  Dispense: 30 capsule; Refill: 0  3. Controlled gout  Check labs  4. Thrombocytopenia (HCC)   he will have labs done today   5. Vitamin D deficiency  Check labs  6. Pes planus of both feet  Doing better in terms of pain with orthotics  7. Capsulitis of left foot  He is doing better  with orthotics, keep follow up with Dr. Al CorpusHyatt  8. Palpitation  - TSH

## 2016-09-12 ENCOUNTER — Other Ambulatory Visit: Payer: Self-pay | Admitting: Family Medicine

## 2016-09-12 DIAGNOSIS — E790 Hyperuricemia without signs of inflammatory arthritis and tophaceous disease: Secondary | ICD-10-CM

## 2016-09-12 LAB — VITAMIN D 25 HYDROXY (VIT D DEFICIENCY, FRACTURES): VIT D 25 HYDROXY: 24 ng/mL — AB (ref 30–100)

## 2016-09-12 LAB — HEMOGLOBIN A1C
Hgb A1c MFr Bld: 5.2 % (ref <5.7)
MEAN PLASMA GLUCOSE: 103 mg/dL

## 2016-09-12 MED ORDER — ALLOPURINOL 100 MG PO TABS: 100.0000 mg | ORAL_TABLET | Freq: Every day | ORAL | 6 refills | Status: DC

## 2016-10-13 ENCOUNTER — Ambulatory Visit: Payer: BLUE CROSS/BLUE SHIELD | Admitting: Family Medicine

## 2016-10-27 ENCOUNTER — Encounter: Payer: Self-pay | Admitting: Podiatry

## 2016-10-27 ENCOUNTER — Ambulatory Visit (INDEPENDENT_AMBULATORY_CARE_PROVIDER_SITE_OTHER): Payer: BLUE CROSS/BLUE SHIELD | Admitting: Podiatry

## 2016-10-27 DIAGNOSIS — M76822 Posterior tibial tendinitis, left leg: Secondary | ICD-10-CM

## 2016-10-27 NOTE — Progress Notes (Signed)
He presents today for follow-up of his posterior tibial tendinitis of the left foot. He states that he is doing better some days than others.  Objective: Vital signs are stable he is alert and oriented 3. I evaluated his posterior tibial tendinitis which appears to be doing much better than it was previously. However when evaluating his orthotic it was quite obvious that he needs a deep heel cup and a large medial flange.  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  Plan: I'm going to request that he follow up with Raiford Nobleick to either have these orthotics augmented or a new pair of Orthotics made and our expense.

## 2016-10-30 ENCOUNTER — Ambulatory Visit: Payer: BLUE CROSS/BLUE SHIELD

## 2016-11-26 ENCOUNTER — Ambulatory Visit (INDEPENDENT_AMBULATORY_CARE_PROVIDER_SITE_OTHER): Payer: Self-pay | Admitting: Podiatry

## 2016-11-26 DIAGNOSIS — M2142 Flat foot [pes planus] (acquired), left foot: Secondary | ICD-10-CM

## 2016-11-26 DIAGNOSIS — M2141 Flat foot [pes planus] (acquired), right foot: Secondary | ICD-10-CM

## 2016-11-26 NOTE — Progress Notes (Signed)
Patient given new set of F/O w deep heel and medial flange...patient seemed to find the fit comfortable.  He was advised to break in slowly..Marland Kitchen

## 2016-11-27 ENCOUNTER — Ambulatory Visit: Payer: BLUE CROSS/BLUE SHIELD

## 2017-04-11 ENCOUNTER — Other Ambulatory Visit: Payer: Self-pay | Admitting: Family Medicine

## 2017-04-11 DIAGNOSIS — E790 Hyperuricemia without signs of inflammatory arthritis and tophaceous disease: Secondary | ICD-10-CM

## 2017-04-12 NOTE — Telephone Encounter (Signed)
Please ask patient when he will return for follow up.  Sending a refill

## 2017-04-14 NOTE — Telephone Encounter (Signed)
LMOM to inform pt °

## 2017-12-25 ENCOUNTER — Other Ambulatory Visit: Payer: Self-pay | Admitting: Family Medicine

## 2017-12-25 DIAGNOSIS — E790 Hyperuricemia without signs of inflammatory arthritis and tophaceous disease: Secondary | ICD-10-CM

## 2019-04-01 ENCOUNTER — Ambulatory Visit: Payer: BLUE CROSS/BLUE SHIELD | Admitting: Family Medicine

## 2019-04-01 ENCOUNTER — Other Ambulatory Visit: Payer: Self-pay

## 2019-04-01 ENCOUNTER — Encounter: Payer: Self-pay | Admitting: Family Medicine

## 2019-04-01 DIAGNOSIS — M109 Gout, unspecified: Secondary | ICD-10-CM

## 2019-04-01 DIAGNOSIS — D696 Thrombocytopenia, unspecified: Secondary | ICD-10-CM | POA: Diagnosis not present

## 2019-04-01 DIAGNOSIS — K219 Gastro-esophageal reflux disease without esophagitis: Secondary | ICD-10-CM

## 2019-04-01 DIAGNOSIS — Z131 Encounter for screening for diabetes mellitus: Secondary | ICD-10-CM

## 2019-04-01 DIAGNOSIS — Z13220 Encounter for screening for lipoid disorders: Secondary | ICD-10-CM

## 2019-04-01 DIAGNOSIS — Z23 Encounter for immunization: Secondary | ICD-10-CM

## 2019-04-01 DIAGNOSIS — I10 Essential (primary) hypertension: Secondary | ICD-10-CM

## 2019-04-01 DIAGNOSIS — E559 Vitamin D deficiency, unspecified: Secondary | ICD-10-CM

## 2019-04-01 MED ORDER — HYDROCHLOROTHIAZIDE 12.5 MG PO TABS: 12.5000 mg | ORAL_TABLET | Freq: Every day | ORAL | 0 refills | Status: DC

## 2019-04-01 MED ORDER — OMEPRAZOLE 40 MG PO CPDR: 40.0000 mg | DELAYED_RELEASE_CAPSULE | Freq: Every day | ORAL | 0 refills | Status: DC

## 2019-04-01 NOTE — Progress Notes (Signed)
Name: Victor Jackson   MRN: [id number]    DOB: 10/23/1975   Date:04/01/2019       Progress Note  Subjective  Chief Complaint  Chief Complaint  Patient presents with  . Elevated BP at Dentist  . Headache    Frontal headaches-couple of hours,every couple of days    HPI   HTN: he has has history of hypertension but at one point secondary to stimulants, he has been gaining weight, less active and bp was high during recent visit to dentist, and high again today. No chest pain , no recent episodes of palpitation, no dizziness. He has occasional headaches.   Gout: he had two episodes of acute onset of left ankle pain, swelling but no redness. No symptoms in a while . Never started Allopurinol, no problems in a while, we will recheck uric acid today    ADHD: son has ADHD, he was diagnosed at the time that his son was diagnosed with the condition. He responded well to Adderal in the past, but had a gap in insurance and has been off medication for a long time. We tried switching him to Vyvanse but it was not covered by insurance and he never called me back to change it back to Adderal. Explained that now we need to get bp to goal before resuming stimulants  Thrombocytopenia: patient has not been seen since 2018 and last platelets was low at 109 , history of thrombocytopenia prior to last visit also, we will recheck today and consider referral to hematologist   Palpitation: he had an episode around July , had previous episodes back in 2018. We will check labs and EKG   GERD: he has weaned self off Omeprazole and is only having symptoms when he splurges with fried or spicy food. He takes medication only prn at most a couple of times a month. He is waking up with epigastric pain every morning, and has some bloating at time, he states umbilical hernia is getting larger but not causing problems.  No blood in stools or change in bowel movement  Dysthmia: based on phq9 , he states he thinks he is lazy  not depressed   Patient Active Problem List   Diagnosis Date Noted  . Pes planus 09/11/2016  . Capsulitis of left foot 09/11/2016  . Controlled gout 05/02/2016  . Umbilical hernia 11/27/2015  . GERD (gastroesophageal reflux disease) 11/27/2015  . Thrombocytopenia (HCC) 11/27/2015  . Vitamin D deficiency 11/27/2015  . History of hypertension 11/27/2015  . ADD (attention deficit disorder) 11/27/2015  . History of hand fracture     Past Surgical History:  Procedure Laterality Date  . none      Family History  Problem Relation Age of Onset  . Arthritis Mother   . Diabetes Mother   . Hypertension Mother   . COPD Father   . Hypertension Father     Social History   Socioeconomic History  . Marital status: Married    Spouse name: [place]  . Number of children: Not on file  . Years of education: Not on file  . Highest education level: Not on file  Occupational History  . Not on file  Social Needs  . Financial resource strain: Not hard at all  . Food insecurity    Worry: Never true    Inability: Never true  . Transportation needs    Medical: No    Non-medical: No  Tobacco Use  . Smoking status: Former Smoker  Packs/day: 0.00    Years: 1.00    Pack years: 0.00    Types: Cigars    Quit date: 06/28/2014    Years since quitting: 4.7  . Smokeless tobacco: Never Used  Substance and Sexual Activity  . Alcohol use: Yes    Alcohol/week: 1.0 standard drinks    Types: 1 Cans of beer per week    Comment: rarely  . Drug use: No  . Sexual activity: Yes    Partners: Female    Birth control/protection: Other-see comments    Comment: girlfriend had tubal ligation   Lifestyle  . Physical activity    Days per week: 0 days    Minutes per session: 0 min  . Stress: Not at all  Relationships  . Social connections    Talks on phone: More than three times a week    Gets together: Once a week    Attends religious service: More than 4 times per year    Active member of club or  organization: No    Attends meetings of clubs or organizations: Never    Relationship status: Married  . Intimate partner violence    Fear of current or ex partner: No    Emotionally abused: No    Physically abused: No    Forced sexual activity: No  Other Topics Concern  . Not on file  Social History Narrative   Lives with girlfriend [name] ) and their 3 children.    Working at [employer], Heritage manager.      Current Outpatient Medications:  .  allopurinol (ZYLOPRIM) 100 MG tablet, TAKE 1 TABLET (100 MG TOTAL) BY MOUTH DAILY. (Patient not taking: Reported on 04/01/2019), Disp: 30 tablet, Rfl: 6 .  lisdexamfetamine (VYVANSE) 40 MG capsule, Take 1 capsule (40 mg total) by mouth every morning. (Patient not taking: Reported on 04/01/2019), Disp: 30 capsule, Rfl: 0  No Known Allergies  I personally reviewed active problem list, medication list, allergies, family history, social history, health maintenance with the patient/caregiver today.   ROS  Constitutional: Negative for fever or weight change.  Respiratory: Negative for cough and shortness of breath.   Cardiovascular: Negative for chest pain or palpitations.  Gastrointestinal: Negative for abdominal pain, no bowel changes.  Musculoskeletal: Negative for gait problem or joint swelling.  Skin: Negative for rash.  Neurological: Negative for dizziness or headache.  No other specific complaints in a complete review of systems (except as listed in HPI above).  Objective  Vitals:   04/01/19 1347  BP: (!) 152/92  Pulse: 82  Resp: 16  Temp: (!) 97.1 F (36.2 C)  TempSrc: Temporal  SpO2: 99%  Weight: 229 lb 8 oz (104.1 kg)  Height: 6\' 3"  (1.905 m)    Body mass index is 28.69 kg/m.  Physical Exam  Constitutional: Patient appears well-developed and well-nourished. Overweight  No distress.  HEENT: head atraumatic, normocephalic, pupils equal and reactive to light Cardiovascular: Normal rate, regular rhythm  and normal heart sounds.  No murmur heard. No BLE edema. Pulmonary/Chest: Effort normal and breath sounds normal. No respiratory distress. Abdominal: Soft.  There is no tenderness. Psychiatric: Patient has a normal mood and affect. behavior is normal. Judgment and thought content normal.  PHQ2/9: Depression screen [facility] 2/9 04/01/2019 09/11/2016 05/02/2016 11/27/2015  Decreased Interest 1 0 0 0  Down, Depressed, Hopeless 1 0 0 0  PHQ - 2 Score 2 0 0 0  Altered sleeping 1 - - -  Tired, decreased energy  1 - - -  Change in appetite 0 - - -  Feeling bad or failure about yourself  0 - - -  Trouble concentrating 3 - - -  Moving slowly or fidgety/restless 0 - - -  Suicidal thoughts 0 - - -  PHQ-9 Score 7 - - -  Difficult doing work/chores Not difficult at all - - -    phq 9 is positive   Fall Risk: Fall Risk  04/01/2019 09/11/2016 05/02/2016 11/27/2015  Falls in the past year? 0 No No No  Number falls in past yr: 0 - - -  Injury with Fall? 0 - - -     Functional Status Survey: Is the patient deaf or have difficulty hearing?: No Does the patient have difficulty seeing, even when wearing glasses/contacts?: No Does the patient have difficulty concentrating, remembering, or making decisions?: No Does the patient have difficulty walking or climbing stairs?: No Does the patient have difficulty dressing or bathing?: No Does the patient have difficulty doing errands alone such as visiting a doctor's office or shopping?: No    Assessment & Plan   1. Needs flu shot  refused  2. Controlled gout  - Uric acid  3. Thrombocytopenia (HCC)  - CBC with Differential/Platelet  4. Gastroesophageal reflux disease without esophagitis  - omeprazole (PRILOSEC) 40 MG capsule; Take 1 capsule (40 mg total) by mouth daily.  Dispense: 30 capsule; Refill: 0  5. Lipid screening  - Lipid panel  6. Diabetes mellitus screening  - Hemoglobin A1c  7. Essential hypertension, benign  - Microalbumin  / creatinine urine ratio - COMPLETE METABOLIC PANEL WITH GFR - CBC with Differential/Platelet - TSH - hydrochlorothiazide (HYDRODIURIL) 12.5 MG tablet; Take 1 tablet (12.5 mg total) by mouth daily.  Dispense: 30 tablet; Refill: 0 - EKG 12-Lead: normal sinus rhythm  8. Vitamin D deficiency  - VITAMIN D 25 Hydroxy (Vit-D Deficiency, Fractures)

## 2019-04-02 LAB — MICROALBUMIN / CREATININE URINE RATIO
Creatinine, Urine: 140 mg/dL (ref 20–320)
Microalb Creat Ratio: 1 ug/mg{creat}
Microalb, Ur: 0.2 mg/dL

## 2019-04-02 LAB — COMPLETE METABOLIC PANEL WITH GFR
AG Ratio: 1.6 (calc) (ref 1.0–2.5)
ALT: 30 U/L (ref 9–46)
AST: 21 U/L (ref 10–40)
Albumin: 4.4 g/dL (ref 3.6–5.1)
Alkaline phosphatase (APISO): 42 U/L (ref 36–130)
BUN: 17 mg/dL (ref 7–25)
CO2: 25 mmol/L (ref 20–32)
Calcium: 10 mg/dL (ref 8.6–10.3)
Chloride: 104 mmol/L (ref 98–110)
Creat: 1.21 mg/dL (ref 0.60–1.35)
GFR, Est African American: 84 mL/min/{1.73_m2} (ref >=60)
GFR, Est Non African American: 72 mL/min/{1.73_m2} (ref >=60)
Globulin: 2.8 g/dL (calc) (ref 1.9–3.7)
Glucose, Bld: 78 mg/dL (ref 65–99)
Potassium: 4.1 mmol/L (ref 3.5–5.3)
Sodium: 139 mmol/L (ref 135–146)
Total Bilirubin: 0.8 mg/dL (ref 0.2–1.2)
Total Protein: 7.2 g/dL (ref 6.1–8.1)

## 2019-04-02 LAB — CBC WITH DIFFERENTIAL/PLATELET
Absolute Monocytes: 616 {cells}/uL (ref 200–950)
Basophils Absolute: 34 {cells}/uL (ref 0–200)
Basophils Relative: 0.5 %
Eosinophils Absolute: 34 {cells}/uL (ref 15–500)
Eosinophils Relative: 0.5 %
HCT: 44.2 % (ref 38.5–50.0)
Hemoglobin: 15 g/dL (ref 13.2–17.1)
Lymphs Abs: 2285 {cells}/uL (ref 850–3900)
MCH: 29.8 pg (ref 27.0–33.0)
MCHC: 33.9 g/dL (ref 32.0–36.0)
MCV: 87.7 fL (ref 80.0–100.0)
MPV: 13.2 fL — ABNORMAL HIGH (ref 7.5–12.5)
Monocytes Relative: 9.2 %
Neutro Abs: 3732 {cells}/uL (ref 1500–7800)
Neutrophils Relative %: 55.7 %
Platelets: 126 Thousand/uL — ABNORMAL LOW (ref 140–400)
RBC: 5.04 Million/uL (ref 4.20–5.80)
RDW: 12.8 % (ref 11.0–15.0)
Total Lymphocyte: 34.1 %
WBC: 6.7 Thousand/uL (ref 3.8–10.8)

## 2019-04-02 LAB — HEMOGLOBIN A1C
Hgb A1c MFr Bld: 5.8 % of total Hgb — ABNORMAL HIGH (ref <5.7)
Mean Plasma Glucose: 120 (calc)
eAG (mmol/L): 6.6 (calc)

## 2019-04-02 LAB — LIPID PANEL
Cholesterol: 189 mg/dL
HDL: 51 mg/dL
LDL Cholesterol (Calc): 112 mg/dL — ABNORMAL HIGH
Non-HDL Cholesterol (Calc): 138 mg/dL — ABNORMAL HIGH
Total CHOL/HDL Ratio: 3.7 (calc)
Triglycerides: 145 mg/dL

## 2019-04-02 LAB — URIC ACID: Uric Acid, Serum: 9.2 mg/dL — ABNORMAL HIGH (ref 4.0–8.0)

## 2019-04-02 LAB — TSH: TSH: 0.78 mIU/L (ref 0.40–4.50)

## 2019-04-02 LAB — VITAMIN D 25 HYDROXY (VIT D DEFICIENCY, FRACTURES): Vit D, 25-Hydroxy: 17 ng/mL — ABNORMAL LOW (ref 30–100)

## 2019-04-05 ENCOUNTER — Other Ambulatory Visit: Payer: Self-pay

## 2019-04-05 ENCOUNTER — Other Ambulatory Visit: Payer: Self-pay | Admitting: Family Medicine

## 2019-04-05 DIAGNOSIS — E790 Hyperuricemia without signs of inflammatory arthritis and tophaceous disease: Secondary | ICD-10-CM

## 2019-04-05 MED ORDER — ALLOPURINOL 100 MG PO TABS: 100.0000 mg | ORAL_TABLET | Freq: Every day | ORAL | 6 refills | Status: DC

## 2019-04-24 ENCOUNTER — Other Ambulatory Visit: Payer: Self-pay | Admitting: Family Medicine

## 2019-04-24 DIAGNOSIS — K219 Gastro-esophageal reflux disease without esophagitis: Secondary | ICD-10-CM

## 2019-04-24 DIAGNOSIS — I10 Essential (primary) hypertension: Secondary | ICD-10-CM

## 2019-05-20 ENCOUNTER — Ambulatory Visit: Payer: 59 | Admitting: Family Medicine

## 2019-05-20 ENCOUNTER — Encounter: Payer: Self-pay | Admitting: Family Medicine

## 2019-05-20 ENCOUNTER — Other Ambulatory Visit: Payer: Self-pay

## 2019-05-20 VITALS — BP 122/80 | HR 91 | Temp 97.0°F | Resp 16 | Ht 75.0 in | Wt 226.8 lb

## 2019-05-20 DIAGNOSIS — L729 Follicular cyst of the skin and subcutaneous tissue, unspecified: Secondary | ICD-10-CM

## 2019-05-20 DIAGNOSIS — E785 Hyperlipidemia, unspecified: Secondary | ICD-10-CM | POA: Diagnosis not present

## 2019-05-20 DIAGNOSIS — F902 Attention-deficit hyperactivity disorder, combined type: Secondary | ICD-10-CM

## 2019-05-20 DIAGNOSIS — K219 Gastro-esophageal reflux disease without esophagitis: Secondary | ICD-10-CM

## 2019-05-20 DIAGNOSIS — D696 Thrombocytopenia, unspecified: Secondary | ICD-10-CM

## 2019-05-20 DIAGNOSIS — M109 Gout, unspecified: Secondary | ICD-10-CM

## 2019-05-20 DIAGNOSIS — I10 Essential (primary) hypertension: Secondary | ICD-10-CM

## 2019-05-20 MED ORDER — AMPHETAMINE-DEXTROAMPHET ER 15 MG PO CP24: 15.0000 mg | ORAL_CAPSULE | ORAL | 0 refills | Status: DC

## 2019-05-20 MED ORDER — HYDROCHLOROTHIAZIDE 12.5 MG PO TABS: 12.5000 mg | ORAL_TABLET | Freq: Every day | ORAL | 2 refills | Status: DC

## 2019-05-20 MED ORDER — OMEPRAZOLE 40 MG PO CPDR: 40.0000 mg | DELAYED_RELEASE_CAPSULE | Freq: Every day | ORAL | 0 refills | Status: DC

## 2019-05-20 NOTE — Addendum Note (Signed)
Addended by: Steele Sizer F on: 05/20/2019 02:06 PM   Modules accepted: Orders

## 2019-05-20 NOTE — Progress Notes (Addendum)
Name: Victor Jackson   MRN: [id number]    DOB: 08-02-1975   Date:05/20/2019       Progress Note  Subjective  Chief Complaint  Chief Complaint  Patient presents with  . ADD  . Gastroesophageal Reflux    Well controlled with medication  . Hypertension    Denies any symptoms  . Gout  . Medication Refill    1 month F/U    HPI  HTN: he has has history of hypertension but at one point secondary to stimulants, however he has gained weight over the years, and is now 39 plus and inactive. His bp was elevated when he visit the dentist this Summer and also during his visit with me Fall 2020, we started him on HCTZ and bp today is at goal, no side effects of medication. No chest, palpitation or SOB  Gout: hehad two episodes of acute onset of left ankle pain, swelling but no redness. No symptoms in a while . Last uric acid was 9.2 and he has been taking Allopurinol since October.   GERD: taking PPI, no heartburn or indigestion, he is also avoiding junk food   Dyslipidemia:  The 10-year ASCVD risk score Denman George DC Jr., et al., 2013) is: 5.8%   Values used to calculate the score:     Age: 43 years     Sex: Male     Is Non-Hispanic African American: Yes     Diabetic: No     Tobacco smoker: No     Systolic Blood Pressure: 122 mmHg     Is BP treated: Yes     HDL Cholesterol: 51 mg/dL     Total Cholesterol: 189 mg/dL  ADHD: son has ADHD, he was diagnosed at the time that his son was diagnosed with the condition. He responded well to Adderal in the past, but had a gap in insurance and has been off medication for a long time. Since bp is at goal we will try resuming medication. He states he works 8 hours a day we will try resuming Adderal to improve focus during work hours.   Thrombocytopenia: platelets slightly better but still low, no easy bruising, not a heavy drinker, we will recheck next visit and if still low we will refer him to hematologist  Cyst Buttocks: going on for years, he states  usually has a small knot on the area, but at times gets larger, tender to touch and red and harder to touch. He is tired, we will refer him to surgeon   Patient Active Problem List   Diagnosis Date Noted  . Pes planus 09/11/2016  . Capsulitis of left foot 09/11/2016  . Controlled gout 05/02/2016  . Umbilical hernia 11/27/2015  . GERD (gastroesophageal reflux disease) 11/27/2015  . Thrombocytopenia (HCC) 11/27/2015  . Vitamin D deficiency 11/27/2015  . History of hypertension 11/27/2015  . ADD (attention deficit disorder) 11/27/2015  . History of hand fracture     Past Surgical History:  Procedure Laterality Date  . none      Family History  Problem Relation Age of Onset  . Arthritis Mother   . Diabetes Mother   . Hypertension Mother   . COPD Father   . Hypertension Father     Social History   Socioeconomic History  . Marital status: Married    Spouse name: [name]  . Number of children: Not on file  . Years of education: Not on file  . Highest education level: Not on file  Occupational History  . Not on file  Social Needs  . Financial resource strain: Not hard at all  . Food insecurity    Worry: Never true    Inability: Never true  . Transportation needs    Medical: No    Non-medical: No  Tobacco Use  . Smoking status: Former Smoker    Packs/day: 0.00    Years: 1.00    Pack years: 0.00    Types: Cigars    Quit date: 06/28/2014    Years since quitting: 4.8  . Smokeless tobacco: Never Used  Substance and Sexual Activity  . Alcohol use: Yes    Alcohol/week: 1.0 standard drinks    Types: 1 Cans of beer per week    Comment: rarely  . Drug use: No  . Sexual activity: Yes    Partners: Female    Birth control/protection: Other-see comments    Comment: girlfriend had tubal ligation   Lifestyle  . Physical activity    Days per week: 0 days    Minutes per session: 0 min  . Stress: Not at all  Relationships  . Social connections    Talks on phone: More than  three times a week    Gets together: Once a week    Attends religious service: More than 4 times per year    Active member of club or organization: No    Attends meetings of clubs or organizations: Never    Relationship status: Married  . Intimate partner violence    Fear of current or ex partner: No    Emotionally abused: No    Physically abused: No    Forced sexual activity: No  Other Topics Concern  . Not on file  Social History Narrative   Lives with girlfriend [name] ) and their 3 children.    Working at [employer], Heritage manager.      Current Outpatient Medications:  .  allopurinol (ZYLOPRIM) 100 MG tablet, Take 1 tablet (100 mg total) by mouth daily., Disp: 30 tablet, Rfl: 6 .  hydrochlorothiazide (HYDRODIURIL) 12.5 MG tablet, TAKE 1 TABLET BY MOUTH EVERY DAY, Disp: 30 tablet, Rfl: 0 .  omeprazole (PRILOSEC) 40 MG capsule, TAKE 1 CAPSULE BY MOUTH EVERY DAY, Disp: 30 capsule, Rfl: 0 .  lisdexamfetamine (VYVANSE) 40 MG capsule, Take 1 capsule (40 mg total) by mouth every morning. (Patient not taking: Reported on 05/20/2019), Disp: 30 capsule, Rfl: 0  No Known Allergies  I personally reviewed active problem list, medication list, allergies, family history, social history, health maintenance with the patient/caregiver today.   ROS  Constitutional: Negative for fever or weight change.  Respiratory: Negative for cough and shortness of breath.   Cardiovascular: Negative for chest pain or palpitations.  Gastrointestinal: Negative for abdominal pain, no bowel changes.  Musculoskeletal: Negative for gait problem or joint swelling.  Skin: Negative for rash.  Neurological: Negative for dizziness or headache.  No other specific complaints in a complete review of systems (except as listed in HPI above).  Objective  Vitals:   05/20/19 1334  BP: 122/80  Pulse: 91  Resp: 16  Temp: (!) 97 F (36.1 C)  TempSrc: Temporal  SpO2: 97%  Weight: 226 lb 12.8 oz (102.9  kg)  Height: 6\' 3"  (1.905 m)    Body mass index is 28.35 kg/m.  Physical Exam  Constitutional: Patient appears well-developed and well-nourished. Overweight. No distress.  HEENT: head atraumatic, normocephalic, pupils equal and reactive to light Cardiovascular: Normal rate,  regular rhythm and normal heart sounds.  No murmur heard. No BLE edema. Pulmonary/Chest: Effort normal and breath sounds normal. No respiratory distress. Abdominal: Soft.  There is no tenderness. Skin: cyst that drained white material with expression, no signs of infection  Psychiatric: Patient has a normal mood and affect. behavior is normal. Judgment and thought content normal.  Recent Results (from the past 2160 hour(s))  Lipid panel     Status: Abnormal   Collection Time: 04/01/19 12:00 AM  Result Value Ref Range   Cholesterol 189 <200 mg/dL   HDL 51 > OR = 40 mg/dL   Triglycerides 161145 <096<150 mg/dL   LDL Cholesterol (Calc) 112 (H) mg/dL (calc)    Comment: Reference range: <100 . Desirable range <100 mg/dL for primary prevention;   <70 mg/dL for patients with CHD or diabetic patients  with > or = 2 CHD risk factors. Marland Kitchen. LDL-C is now calculated using the Martin-Hopkins  calculation, which is a validated novel method providing  better accuracy than the Friedewald equation in the  estimation of LDL-C.  Horald PollenMartin SS et al. Lenox AhrJAMA. 0454;098(112013;310(19): 2061-2068  (http://education.[facility].com/faq/FAQ164)    Total CHOL/HDL Ratio 3.7 <5.0 (calc)   Non-HDL Cholesterol (Calc) 138 (H) <130 mg/dL (calc)    Comment: For patients with diabetes plus 1 major ASCVD risk  factor, treating to a non-HDL-C goal of <100 mg/dL  (LDL-C of <91<70 mg/dL) is considered a therapeutic  option.   Microalbumin / creatinine urine ratio     Status: None   Collection Time: 04/01/19 12:00 AM  Result Value Ref Range   Creatinine, Urine 140 20 - 320 mg/dL   Microalb, Ur 0.2 mg/dL    Comment: Reference Range Not established    Microalb  Creat Ratio 1 <30 mcg/mg creat    Comment: . The ADA defines abnormalities in albumin excretion as follows: Marland Kitchen. Category         Result (mcg/mg creatinine) . Normal                    <30 Microalbuminuria         30-299  Clinical albuminuria   > OR = 300 . The ADA recommends that at least two of three specimens collected within a 3-6 month period be abnormal before considering a patient to be within a diagnostic category.   COMPLETE METABOLIC PANEL WITH GFR     Status: None   Collection Time: 04/01/19 12:00 AM  Result Value Ref Range   Glucose, Bld 78 65 - 99 mg/dL    Comment: .            Fasting reference interval .    BUN 17 7 - 25 mg/dL   Creat 4.781.21 2.950.60 - 6.211.35 mg/dL   GFR, Est Non African American 72 > OR = 60 mL/min/1.4873m2   GFR, Est African American 84 > OR = 60 mL/min/1.4573m2   BUN/Creatinine Ratio NOT APPLICABLE 6 - 22 (calc)   Sodium 139 135 - 146 mmol/L   Potassium 4.1 3.5 - 5.3 mmol/L   Chloride 104 98 - 110 mmol/L   CO2 25 20 - 32 mmol/L   Calcium 10.0 8.6 - 10.3 mg/dL   Total Protein 7.2 6.1 - 8.1 g/dL   Albumin 4.4 3.6 - 5.1 g/dL   Globulin 2.8 1.9 - 3.7 g/dL (calc)   AG Ratio 1.6 1.0 - 2.5 (calc)   Total Bilirubin 0.8 0.2 - 1.2 mg/dL   Alkaline phosphatase (APISO) 42 36 -  130 U/L   AST 21 10 - 40 U/L   ALT 30 9 - 46 U/L  CBC with Differential/Platelet     Status: Abnormal   Collection Time: 04/01/19 12:00 AM  Result Value Ref Range   WBC 6.7 3.8 - 10.8 Thousand/uL   RBC 5.04 4.20 - 5.80 Million/uL   Hemoglobin 15.0 13.2 - 17.1 g/dL   HCT 16.1 09.6 - 04.5 %   MCV 87.7 80.0 - 100.0 fL   MCH 29.8 27.0 - 33.0 pg   MCHC 33.9 32.0 - 36.0 g/dL   RDW 40.9 81.1 - 91.4 %   Platelets 126 (L) 140 - 400 Thousand/uL   MPV 13.2 (H) 7.5 - 12.5 fL   Neutro Abs 3,732 1,500 - 7,800 cells/uL   Lymphs Abs 2,285 850 - 3,900 cells/uL   Absolute Monocytes 616 200 - 950 cells/uL   Eosinophils Absolute 34 15 - 500 cells/uL   Basophils Absolute 34 0 - 200 cells/uL    Neutrophils Relative % 55.7 %   Total Lymphocyte 34.1 %   Monocytes Relative 9.2 %   Eosinophils Relative 0.5 %   Basophils Relative 0.5 %  Hemoglobin A1c     Status: Abnormal   Collection Time: 04/01/19 12:00 AM  Result Value Ref Range   Hgb A1c MFr Bld 5.8 (H) <5.7 % of total Hgb    Comment: For someone without known diabetes, a hemoglobin  A1c value between 5.7% and 6.4% is consistent with prediabetes and should be confirmed with a  follow-up test. . For someone with known diabetes, a value <7% indicates that their diabetes is well controlled. A1c targets should be individualized based on duration of diabetes, age, comorbid conditions, and other considerations. . This assay result is consistent with an increased risk of diabetes. . Currently, no consensus exists regarding use of hemoglobin A1c for diagnosis of diabetes for children. .    Mean Plasma Glucose 120 (calc)   eAG (mmol/L) 6.6 (calc)  VITAMIN D 25 Hydroxy (Vit-D Deficiency, Fractures)     Status: Abnormal   Collection Time: 04/01/19 12:00 AM  Result Value Ref Range   Vit D, 25-Hydroxy 17 (L) 30 - 100 ng/mL    Comment: Vitamin D Status         25-OH Vitamin D: . Deficiency:                    <20 ng/mL Insufficiency:             20 - 29 ng/mL Optimal:                 > or = 30 ng/mL . For 25-OH Vitamin D testing on patients on  D2-supplementation and patients for whom quantitation  of D2 and D3 fractions is required, the [facility](TM) 25-OH VIT D, (D2,D3), LC/MS/MS is recommended: order  code 78295 (patients >5yrs). See Note 1 . Note 1 . For additional information, please refer to  http://education.[facility].com/faq/FAQ199  (This link is being provided for informational/ educational purposes only.)   TSH     Status: None   Collection Time: 04/01/19 12:00 AM  Result Value Ref Range   TSH 0.78 0.40 - 4.50 mIU/L  Uric acid     Status: Abnormal   Collection Time: 04/01/19 12:00 AM  Result  Value Ref Range   Uric Acid, Serum 9.2 (H) 4.0 - 8.0 mg/dL    Comment: Therapeutic target for gout patients: <6.0 mg/dL .     PHQ2/9:  Depression screen [facility] 2/9 05/20/2019 04/01/2019 09/11/2016 05/02/2016 11/27/2015  Decreased Interest 0 1 0 0 0  Down, Depressed, Hopeless 0 1 0 0 0  PHQ - 2 Score 0 2 0 0 0  Altered sleeping 0 1 - - -  Tired, decreased energy 0 1 - - -  Change in appetite 0 0 - - -  Feeling bad or failure about yourself  0 0 - - -  Trouble concentrating 0 3 - - -  Moving slowly or fidgety/restless 0 0 - - -  Suicidal thoughts 0 0 - - -  PHQ-9 Score 0 7 - - -  Difficult doing work/chores - Not difficult at all - - -    phq 9 is negative   Fall Risk: Fall Risk  05/20/2019 04/01/2019 09/11/2016 05/02/2016 11/27/2015  Falls in the past year? 0 0 No No No  Number falls in past yr: 0 0 - - -  Injury with Fall? 0 0 - - -     Functional Status Survey: Is the patient deaf or have difficulty hearing?: No Does the patient have difficulty seeing, even when wearing glasses/contacts?: No Does the patient have difficulty concentrating, remembering, or making decisions?: No Does the patient have difficulty walking or climbing stairs?: No Does the patient have difficulty dressing or bathing?: No Does the patient have difficulty doing errands alone such as visiting a doctor's office or shopping?: No    Assessment & Plan  1. Essential hypertension, benign  - hydrochlorothiazide (HYDRODIURIL) 12.5 MG tablet; Take 1 tablet (12.5 mg total) by mouth daily.  Dispense: 30 tablet; Refill: 2  2. Controlled gout  Continue allopurinol   3. Attention deficit hyperactivity disorder (ADHD), combined type  - amphetamine-dextroamphetamine (ADDERALL XR) 15 MG 24 hr capsule; Take 1 capsule by mouth every morning.  Dispense: 30 capsule; Refill: 0  4. Dyslipidemia  Discussed life style modification   5. Thrombocytopenia ([place])  Recheck next visit  6. Gastroesophageal reflux disease  without esophagitis  - omeprazole (PRILOSEC) 40 MG capsule; Take 1 capsule (40 mg total) by mouth daily.  Dispense: 90 capsule; Refill: 0 7. Cyst of buttocks  - Ambulatory referral to General Surgery

## 2019-05-24 ENCOUNTER — Other Ambulatory Visit: Payer: Self-pay

## 2019-05-24 ENCOUNTER — Encounter: Payer: Self-pay | Admitting: General Surgery

## 2019-05-24 ENCOUNTER — Ambulatory Visit (INDEPENDENT_AMBULATORY_CARE_PROVIDER_SITE_OTHER): Payer: 59 | Admitting: General Surgery

## 2019-05-24 VITALS — BP 118/81 | HR 78 | Temp 97.5°F | Ht 75.0 in | Wt 228.0 lb

## 2019-05-24 DIAGNOSIS — L989 Disorder of the skin and subcutaneous tissue, unspecified: Secondary | ICD-10-CM

## 2019-05-24 NOTE — Progress Notes (Signed)
Patient ID: Victor Jackson, male   DOB: [birth year], 43 y.o.   MRN: [id number]  Chief Complaint  Patient presents with  . New Patient (Initial Visit)    cyst on buttocks    HPI Victor Jackson is a 43 y.o. male.   He was referred by his primary care provider, Dr. Ancil Boozer, for surgical evaluation of a cyst on his left buttock.  He states that it is been present for years.  It never drains any fluid, pus, or blood.  He says that it sometimes swells up and is occasionally tender when it does so.  He has never had any treatment, either operative or medical.  He recently saw her in clinic and mentioned that he is tired of having it and he was referred to our office for further evaluation and treatment.  He cannot describe any alleviating or exacerbating factors.  He says that the swelling will just come on without warning and resolve on its own.   Past Medical History:  Diagnosis Date  . ADD (attention deficit disorder)   . GERD (gastroesophageal reflux disease)   . Hypertension   . Vitamin D deficiency   Gout  Past Surgical History:  Procedure Laterality Date  . KNEE ARTHROSCOPY Left 1998  . none      Family History  Problem Relation Age of Onset  . Arthritis Mother   . Diabetes Mother   . Hypertension Mother   . COPD Father   . Hypertension Father     Social History Social History   Tobacco Use  . Smoking status: Former Smoker    Packs/day: 0.00    Years: 1.00    Pack years: 0.00    Types: Cigars    Quit date: 06/28/2014    Years since quitting: 4.9  . Smokeless tobacco: Never Used  Substance Use Topics  . Alcohol use: Yes    Alcohol/week: 1.0 standard drinks    Types: 1 Cans of beer per week    Comment: rarely  . Drug use: No    No Known Allergies  Current Outpatient Medications  Medication Sig Dispense Refill  . allopurinol (ZYLOPRIM) 100 MG tablet Take 1 tablet (100 mg total) by mouth daily. 30 tablet 6  . amphetamine-dextroamphetamine (ADDERALL XR) 15 MG 24 hr  capsule Take 1 capsule by mouth every morning. 30 capsule 0  . hydrochlorothiazide (HYDRODIURIL) 12.5 MG tablet Take 1 tablet (12.5 mg total) by mouth daily. 30 tablet 2  . omeprazole (PRILOSEC) 40 MG capsule Take 1 capsule (40 mg total) by mouth daily. 90 capsule 0   No current facility-administered medications for this visit.     Review of Systems Review of Systems  All other systems reviewed and are negative.   Blood pressure 118/81, pulse 78, temperature (!) 97.5 F (36.4 C), height 6\' 3"  (1.905 m), weight 228 lb (103.4 kg), SpO2 96 %.  Physical Exam Physical Exam Vitals signs reviewed. Exam conducted with a chaperone present.  Constitutional:      General: He is not in acute distress.    Appearance: Normal appearance. He is normal weight.  HENT:     Head: Normocephalic and atraumatic.     Nose:     Comments: Covered with a mask secondary to COVID-19 precautions    Mouth/Throat:     Comments: Covered with a mask secondary to COVID-19 precautions Eyes:     General: No scleral icterus.       Right eye: No discharge.  Left eye: No discharge.     Conjunctiva/sclera: Conjunctivae normal.  Neck:     Musculoskeletal: Normal range of motion.     Comments: No palpable thyroid masses or thyromegaly identified. Cardiovascular:     Rate and Rhythm: Normal rate and regular rhythm.     Pulses: Normal pulses.     Heart sounds: No murmur.  Pulmonary:     Effort: Pulmonary effort is normal. No respiratory distress.     Breath sounds: Normal breath sounds.  Abdominal:     General: Abdomen is flat. Bowel sounds are normal.     Palpations: Abdomen is soft.  Genitourinary:    Comments: Deferred Musculoskeletal:        General: No swelling or deformity.  Lymphadenopathy:     Cervical: No cervical adenopathy.  Skin:    General: Skin is warm and dry.     Findings: Lesion present.          Comments: There is a faint area of slightly different pigmentation on the patient's  left buttock.  The skin shows signs of prior inflammation however none is visible today.  On palpation, there is surrounding induration, however it is not discrete or organized.  Neurological:     General: No focal deficit present.     Mental Status: He is alert and oriented to person, place, and time.  Psychiatric:        Mood and Affect: Mood normal.        Behavior: Behavior normal.     Data Reviewed I reviewed Dr. Carlynn Purl' note from May 20, 2019.  At that visit, she apparently was able to express white material from the cyst site.  Assessment This is a 43 year old man who has a history of a waxing and waning cyst on his left buttock.  Today, the area is not discrete or well defined.  Apparently, Dr. Carlynn Purl was able to express some material from the area last week, but none is expressible today.  Plan While I do not disagree that there is likely a cyst or similar lesion in the area, today, it is not well-defined and is more a vague area of induration.  Because of this, I think proceeding with a bedside procedure would not be particularly fruitful.  I told Mr. Nault that the next time it becomes inflamed and swollen, he should contact our office and we would see him to hopefully perform either an incision and drainage or an excision of the area.  He will contact us the next time it occurs.    Duanne Guess 05/24/2019, 4:12 PM

## 2019-05-24 NOTE — Patient Instructions (Addendum)
Dr.Cannon recommended patient is to give our office a call, if the area has become inflamed or irritated.

## 2019-06-03 ENCOUNTER — Telehealth: Payer: Self-pay | Admitting: Family Medicine

## 2019-06-03 ENCOUNTER — Ambulatory Visit: Payer: 59

## 2019-06-03 NOTE — Telephone Encounter (Signed)
Pt called and stated that he will need a PA for medication amphetamine-dextroamphetamine (ADDERALL XR) 15 MG 24 hr capsule [[id number]]     Please advise

## 2019-06-07 NOTE — Telephone Encounter (Signed)
Waiting on response from insurance company.

## 2019-07-26 ENCOUNTER — Ambulatory Visit: Payer: Self-pay | Attending: Internal Medicine

## 2019-07-26 DIAGNOSIS — Z20822 Contact with and (suspected) exposure to covid-19: Secondary | ICD-10-CM

## 2019-07-26 DIAGNOSIS — U071 COVID-19: Secondary | ICD-10-CM | POA: Insufficient documentation

## 2019-07-27 ENCOUNTER — Encounter: Payer: Self-pay | Admitting: Family Medicine

## 2019-07-27 LAB — NOVEL CORONAVIRUS, NAA: SARS-CoV-2, NAA: DETECTED — AB

## 2019-07-27 NOTE — Progress Notes (Signed)
Your test for COVID-19 was positive ("detected"), meaning that you were infected with the novel coronavirus and could give the germ to others.    Please continue isolation at home, for at least 10 days since the start of your fever/cough/breathlessness and until you have had 24 hours without fever (without taking a fever reducer) and with any cough/breathlessness improving. Use over-the-counter medications for symptoms.  If you have had no symptoms, but were exposed to someone who was positive for COVID-19, you will need to quarantine and self-isolate for 14 days from the date of exposure.    Please continue good preventive care measures, including:  frequent hand-washing, avoid touching your face, cover coughs/sneezes, stay out of crowds and keep a 6 foot distance from others.  Clean hard surfaces touched frequently with disinfectant cleaning products.   Please check in with your primary care provider about your positive test result.  Go to the nearest urgent care or ED for assessment if you have severe breathlessness or severe weakness/fatigue (ex needing new help getting out of bed or to the bathroom).  Members of your household will also need to quarantine for 14 days from the date of your positive test. You may be contacted to discuss possible treatment options, and you may also be contacted by the health department for follow up. Please call [facility] at [phone] if you have any questions or concerns.     

## 2019-08-19 ENCOUNTER — Encounter: Payer: Self-pay | Admitting: Family Medicine

## 2019-08-19 ENCOUNTER — Other Ambulatory Visit: Payer: Self-pay

## 2019-08-19 ENCOUNTER — Ambulatory Visit (INDEPENDENT_AMBULATORY_CARE_PROVIDER_SITE_OTHER): Payer: 59 | Admitting: Family Medicine

## 2019-08-19 VITALS — Temp 96.9°F | Ht 75.0 in | Wt 220.0 lb

## 2019-08-19 DIAGNOSIS — D696 Thrombocytopenia, unspecified: Secondary | ICD-10-CM

## 2019-08-19 DIAGNOSIS — E785 Hyperlipidemia, unspecified: Secondary | ICD-10-CM | POA: Insufficient documentation

## 2019-08-19 DIAGNOSIS — M109 Gout, unspecified: Secondary | ICD-10-CM | POA: Diagnosis not present

## 2019-08-19 DIAGNOSIS — F902 Attention-deficit hyperactivity disorder, combined type: Secondary | ICD-10-CM

## 2019-08-19 DIAGNOSIS — R7303 Prediabetes: Secondary | ICD-10-CM

## 2019-08-19 DIAGNOSIS — E559 Vitamin D deficiency, unspecified: Secondary | ICD-10-CM

## 2019-08-19 DIAGNOSIS — I10 Essential (primary) hypertension: Secondary | ICD-10-CM | POA: Insufficient documentation

## 2019-08-19 DIAGNOSIS — E790 Hyperuricemia without signs of inflammatory arthritis and tophaceous disease: Secondary | ICD-10-CM

## 2019-08-19 DIAGNOSIS — K219 Gastro-esophageal reflux disease without esophagitis: Secondary | ICD-10-CM

## 2019-08-19 MED ORDER — VALSARTAN 160 MG PO TABS: 160.0000 mg | ORAL_TABLET | Freq: Every day | ORAL | 0 refills | Status: DC

## 2019-08-19 MED ORDER — OMEPRAZOLE 40 MG PO CPDR: 40.0000 mg | DELAYED_RELEASE_CAPSULE | Freq: Every day | ORAL | 0 refills | Status: DC

## 2019-08-19 MED ORDER — VITAMIN D (ERGOCALCIFEROL) 1.25 MG (50000 UNIT) PO CAPS: 50000.0000 [IU] | ORAL_CAPSULE | ORAL | 1 refills | Status: DC

## 2019-08-19 MED ORDER — ALLOPURINOL 100 MG PO TABS: 100.0000 mg | ORAL_TABLET | Freq: Every day | ORAL | 0 refills | Status: DC

## 2019-08-19 NOTE — Progress Notes (Signed)
Name: Victor Jackson   MRN: [id number]    DOB: [birth year]   Date:08/19/2019       Progress Note  Subjective  Chief Complaint  Chief Complaint  Patient presents with  . Medication Refill  . Hypertension    Denies any symptoms  . Gout  . Dyslipidemia  . Gastroesophageal Reflux  . ADHD  . Thrombocytopenia  . Sinus Problem    Has had dry sinuses since COVID-tested positive on Feb.9     I connected with  Kwabena Strutz Spark  on 08/19/19 at  1:20 PM EST by a video enabled telemedicine application and verified that I am speaking with the correct person using two identifiers.  I discussed the limitations of evaluation and management by telemedicine and the availability of in person appointments. The patient expressed understanding and agreed to proceed. Staff also discussed with the patient that there may be a patient responsible charge related to this service. Patient Location: at home  Provider Location: [facility]   HPI   HTN: he has has history of hypertension but at one point secondary to stimulants, however he has gained weight over the years, and is now 39 plus and inactive. His bp was elevated when he visit the dentist this Summer and also during his visit with me Fall 2020, we started him on HCTZ and bp has been at goal since, however because of history of gout and high uric acid we will switch to Diovan, advised him to come in for CMA visit and bp check in 1-2 weeks   Gout: hehad two episodes of acute onset of left ankle pain, swelling but no redness.No symptoms in a while. Last uric acid was 9.2 and he has been taking Allopurinol since October and denies any side effects. We will stop HCTZ since he has a history of gout   GERD: taking PPI, no heartburn or indigestion, even though he has been eating more junk, symptoms has resolved  Dyslipidemia: discussed life style modification   The 10-year ASCVD risk score Denman George DC Jr., et al., 2013) is: 5.7%   Values used to  calculate the score:     Age: 45 years     Sex: Male     Is Non-Hispanic African American: Yes     Diabetic: No     Tobacco smoker: No     Systolic Blood Pressure: 118 mmHg     Is BP treated: Yes     HDL Cholesterol: 51 mg/dL     Total Cholesterol: 189 mg/dL  ADHD: son has ADHD, he was diagnosed at the time that his son was diagnosed with the condition. He responded well to Adderal in the past, but had a gap in insurance and has been off medication for a long time.Since bp was at goal in Dec we sent rx of Adderal but it only got approved today, he has not been on medication yet. He will let me know in a couple of weeks if dose is working and will return for bp check in 1-2 weeks with cma  Thrombocytopenia: platelets slightly better but still low, no easy bruising, not a heavy drinker, we will recheck next visit and if still low we will refer him to hematologist. It was a virtual visit and we will recheck it when he comes in person for a visit   Cyst Buttocks: going on for years, he states usually has a small knot on the area, but at times gets larger, tender  to touch and red and harder to touch.  He saw Dr. Celine Ahr back in Dec and was given reassurance  Pre-diabetes: A1C was elevated, he was eating healthier and had lost some weight but since COVID- 19 infection he was staying inside his house more often and started to eat more junk food and weight is going up again, he states he will resume a healthier diet today  History of COVID-19: she states the only symptoms still present is feeling groggy when he gets up in am, but resolved within a couple of hours. No fatigue, wheezing, cough, sob or chest pain  Patient Active Problem List   Diagnosis Date Noted  . Pes planus 09/11/2016  . Capsulitis of left foot 09/11/2016  . Controlled gout 05/02/2016  . Umbilical hernia 09/73/5329  . GERD (gastroesophageal reflux disease) 11/27/2015  . Thrombocytopenia ([place]) 11/27/2015  . Vitamin D  deficiency 11/27/2015  . History of hypertension 11/27/2015  . ADD (attention deficit disorder) 11/27/2015  . History of hand fracture     Past Surgical History:  Procedure Laterality Date  . KNEE ARTHROSCOPY Left 1998  . none      Family History  Problem Relation Age of Onset  . Arthritis Mother   . Diabetes Mother   . Hypertension Mother   . COPD Father   . Hypertension Father     Social History   Tobacco Use  . Smoking status: Former Smoker    Packs/day: 0.00    Years: 1.00    Pack years: 0.00    Types: Cigars    Quit date: 06/28/2014    Years since quitting: 5.1  . Smokeless tobacco: Never Used  Substance Use Topics  . Alcohol use: Yes    Alcohol/week: 1.0 standard drinks    Types: 1 Cans of beer per week    Comment: rarely    Current Outpatient Medications:  .  allopurinol (ZYLOPRIM) 100 MG tablet, Take 1 tablet (100 mg total) by mouth daily., Disp: 30 tablet, Rfl: 6 .  hydrochlorothiazide (HYDRODIURIL) 12.5 MG tablet, Take 1 tablet (12.5 mg total) by mouth daily., Disp: 30 tablet, Rfl: 2 .  omeprazole (PRILOSEC) 40 MG capsule, Take 1 capsule (40 mg total) by mouth daily., Disp: 90 capsule, Rfl: 0 .  amphetamine-dextroamphetamine (ADDERALL XR) 15 MG 24 hr capsule, Take 1 capsule by mouth every morning. (Patient not taking: Reported on 08/19/2019), Disp: 30 capsule, Rfl: 0  No Known Allergies  I personally reviewed active problem list, medication list, allergies, family history, social history, health maintenance with the patient/caregiver today.   ROS  Ten systems reviewed and is negative except as mentioned in HPI   Objective  Virtual encounter, vitals not obtained.  Body mass index is 27.5 kg/m.  Physical Exam  Awake, alert and oriented  PHQ2/9: Depression screen [facility] 2/9 08/19/2019 05/20/2019 04/01/2019 09/11/2016 05/02/2016  Decreased Interest 0 0 1 0 0  Down, Depressed, Hopeless 0 0 1 0 0  PHQ - 2 Score 0 0 2 0 0  Altered sleeping 0 0 1 - -    Tired, decreased energy 0 0 1 - -  Change in appetite 0 0 0 - -  Feeling bad or failure about yourself  0 0 0 - -  Trouble concentrating 0 0 3 - -  Moving slowly or fidgety/restless 0 0 0 - -  Suicidal thoughts 0 0 0 - -  PHQ-9 Score 0 0 7 - -  Difficult doing work/chores Not difficult at all -  Not difficult at all - -   PHQ-2/9 Result is negative.    Fall Risk: Fall Risk  08/19/2019 05/20/2019 04/01/2019 09/11/2016 05/02/2016  Falls in the past year? 0 0 0 No No  Number falls in past yr: 0 0 0 - -  Injury with Fall? 0 0 0 - -     Assessment & Plan  1. Dyslipidemia  Reminded him of life style modification   2. Essential hypertension, benign  Stop hctz and start diovan  - valsartan (DIOVAN) 160 MG tablet; Take 1 tablet (160 mg total) by mouth daily. In place of hctz  Dispense: 90 tablet; Refill: 0  3. Controlled gout  Continue allopurinol   4. Attention deficit hyperactivity disorder (ADHD), combined type  He will fill medication today   5. Vitamin D deficiency  - Vitamin D, Ergocalciferol, (DRISDOL) 1.25 MG (50000 UNIT) CAPS capsule; Take 1 capsule (50,000 Units total) by mouth every 7 (seven) days.  Dispense: 12 capsule; Refill: 1  6. Elevated uric acid in blood  - allopurinol (ZYLOPRIM) 100 MG tablet; Take 1 tablet (100 mg total) by mouth daily.  Dispense: 90 tablet; Refill: 0  7. Thrombocytopenia (HCC)  Recheck next visit   8. Gastroesophageal reflux disease without esophagitis  - omeprazole (PRILOSEC) 40 MG capsule; Take 1 capsule (40 mg total) by mouth daily.  Dispense: 90 capsule; Refill: 0  9. Pre-diabetes  Needs to resume a low sugar diet  I discussed the assessment and treatment plan with the patient. The patient was provided an opportunity to ask questions and all were answered. The patient agreed with the plan and demonstrated an understanding of the instructions.  The patient was advised to call back or seek an in-person evaluation if the symptoms  worsen or if the condition fails to improve as anticipated.  I provided 25 minutes of non-face-to-face time during this encounter.

## 2019-09-02 ENCOUNTER — Ambulatory Visit: Payer: 59

## 2019-09-05 ENCOUNTER — Ambulatory Visit: Payer: 59

## 2019-09-07 ENCOUNTER — Other Ambulatory Visit: Payer: Self-pay

## 2019-09-07 ENCOUNTER — Ambulatory Visit: Payer: 59

## 2019-09-07 VITALS — BP 128/86 | HR 80

## 2019-09-07 DIAGNOSIS — I10 Essential (primary) hypertension: Secondary | ICD-10-CM

## 2019-09-07 NOTE — Progress Notes (Signed)
Patient is here for a blood pressure check. Patient denies chest pain, palpitations, shortness of breath or visual disturbances. Today during nurse visit first check blood pressure was 128/86 and heart rate was 80. He does take any blood pressure medications.

## 2019-09-25 ENCOUNTER — Other Ambulatory Visit: Payer: Self-pay | Admitting: Family Medicine

## 2019-09-25 DIAGNOSIS — F902 Attention-deficit hyperactivity disorder, combined type: Secondary | ICD-10-CM

## 2019-09-26 MED ORDER — AMPHETAMINE-DEXTROAMPHET ER 15 MG PO CP24: 15.0000 mg | ORAL_CAPSULE | ORAL | 0 refills | Status: DC

## 2019-10-03 ENCOUNTER — Encounter: Payer: Self-pay | Admitting: Family Medicine

## 2019-10-04 ENCOUNTER — Ambulatory Visit (INDEPENDENT_AMBULATORY_CARE_PROVIDER_SITE_OTHER): Payer: 59 | Admitting: General Surgery

## 2019-10-04 ENCOUNTER — Other Ambulatory Visit: Payer: Self-pay

## 2019-10-04 ENCOUNTER — Encounter: Payer: Self-pay | Admitting: General Surgery

## 2019-10-04 VITALS — BP 121/82 | HR 89 | Temp 97.9°F | Resp 14 | Ht 75.0 in | Wt 216.0 lb

## 2019-10-04 DIAGNOSIS — L729 Follicular cyst of the skin and subcutaneous tissue, unspecified: Secondary | ICD-10-CM

## 2019-10-04 MED ORDER — AMOXICILLIN-POT CLAVULANATE 875-125 MG PO TABS: 1.0000 | ORAL_TABLET | Freq: Two times a day (BID) | ORAL | 0 refills | Status: AC

## 2019-10-04 NOTE — Patient Instructions (Addendum)
Today we have drained your Abscess in the office. The numbing medication will wear off in approximately 4-8 hours. You will have some pain to the area afterwards but should not be as severe as prior to the procedure. You may use Ibuprofen or Tylenol as needed for pain control.  If you have been given antibiotics, please continue to take them after your procedure.  You will need to change and repack the area twice a day.  Pack the area well using about the same length of packing as you removed. this will gradually get less and less in length over time. Cover the area with gauze and tape. You may remove the packing and take a shower and let the warm water run over the area and pat dry and repack the area. The area will drain more the first few days and then get less after that.   We will see you back as scheduled below.   If you have any questions or concerns prior to your appointment, please call our office and speak with a nurse.  Incision and Drainage Incision and drainage is a surgical procedure to open and drain a fluid-filled sac. The sac may be filled with pus, mucus, or blood. Examples of fluid-filled sacs that may need surgical drainage include cysts, skin infections (abscesses), and red lumps that develop from a ruptured cyst or a small abscess (boils). You may need this procedure if the affected area is large, painful, infected, or not healing well. Tell a health care provider about:  Any allergies you have.  All medicines you are taking, including vitamins, herbs, eye drops, creams, and over-the-counter medicines.  Any problems you or family members have had with anesthetic medicines.  Any blood disorders you have.  Any surgeries you have had.  Any medical conditions you have.  Whether you are pregnant or may be pregnant. What are the risks? Generally, this is a safe procedure. However, problems may occur, including:  Infection.  Bleeding.  Allergic reactions to  medicines.  Scarring.  What happens before the procedure?  You may need an ultrasound or other imaging tests to see how large or deep the fluid-filled sac is.  You may have blood tests to check for infection.  You may get a tetanus shot.  You may be given antibiotic medicine to help prevent infection.  Follow instructions from your health care provider about eating or drinking restrictions.  Ask your health care provider about: ? Changing or stopping your regular medicines. This is especially important if you are taking diabetes medicines or blood thinners. ? Taking medicines such as aspirin and ibuprofen. These medicines can thin your blood. Do not take these medicines before your procedure if your health care provider instructs you not to.  Plan to have someone take you home after the procedure.  If you will be going home right after the procedure, plan to have someone stay with you for 24 hours. What happens during the procedure?  To reduce your risk of infection: ? Your health care team will wash or sanitize their hands. ? Your skin will be washed with soap.  You will be given one or more of the following: ? A medicine to help you relax (sedative). ? A medicine to numb the area (local anesthetic). ? A medicine to make you fall asleep (general anesthetic).  An incision will be made in the top of the fluid-filled sac.  The contents of the sac may be squeezed out, or a syringe  or tube (catheter)may be used to empty the sac.  The catheter may be left in place for several weeks to drain any fluid. Or, your health care provider may stitch open the edges of the incision to make a long-term opening for drainage (marsupialization).  The inside of the sac may be washed out (irrigated) with a sterile solution and packed with gauze before it is covered with a bandage (dressing). The procedure may vary among health care providers and hospitals. What happens after the  procedure?  Your blood pressure, heart rate, breathing rate, and blood oxygen level will be monitored often until the medicines you were given have worn off.  Do not drive for 24 hours if you received a sedative. This information is not intended to replace advice given to you by your health care provider. Make sure you discuss any questions you have with your health care provider. Document Released: 11/26/2000 Document Revised: 11/08/2015 Document Reviewed: 03/23/2015 Elsevier Interactive Patient Education  2017 [employer]   Incision and Drainage, Care After Refer to this sheet in the next few weeks. These instructions provide you with information about caring for yourself after your procedure. Your health care provider may also give you more specific instructions. Your treatment has been planned according to current medical practices, but problems sometimes occur. Call your health care provider if you have any problems or questions after your procedure. What can I expect after the procedure? After the procedure, it is common to have:  Pain or discomfort around your incision site.  Drainage from your incision.  Follow these instructions at home:  Take over-the-counter and prescription medicines only as told by your health care provider.  If you were prescribed an antibiotic medicine, take it as told by your health care provider.Do not stop taking the antibiotic even if you start to feel better.  Followinstructions from your health care provider about: ? How to take care of your incision. ? When and how you should change your packing and bandage (dressing). Wash your hands with soap and water before you change your dressing. If soap and water are not available, use hand sanitizer. ? When you should remove your dressing.  Do not take baths, swim, or use a hot tub until your health care provider approves.  Keep all follow-up visits as told by your health care provider. This is  important.  Check your incision area every day for signs of infection. Check for: ? More redness, swelling, or pain. ? More fluid or blood. ? Warmth. ? Pus or a bad smell. Contact a health care provider if:  Your cyst or abscess returns.  You have a fever.  You have more redness, swelling, or pain around your incision.  You have more fluid or blood coming from your incision.  Your incision feels warm to the touch.  You have pus or a bad smell coming from your incision. Get help right away if:  You have severe pain or bleeding.  You cannot eat or drink without vomiting.  You have decreased urine output.  You become short of breath.  You have chest pain.  You cough up blood.  The area where the incision and drainage occurred becomes numb or it tingles. This information is not intended to replace advice given to you by your health care provider. Make sure you discuss any questions you have with your health care provider. Document Released: 08/25/2011 Document Revised: 11/02/2015 Document Reviewed: 03/23/2015 Elsevier Interactive Patient Education  2017 [employer].

## 2019-10-04 NOTE — Progress Notes (Signed)
Victor Jackson is here today for follow-up of a cyst on his left buttock.  I initially saw him in December, but at that time there was no fluctuance or discretely drainable collection.  He was advised to contact our office if it ever flared up again.  He did so and is here today for further evaluation.  As before, he states that it just flares up without any obvious inciting incident.  This most recent episode started on Saturday, got worse on Sunday, but is a little bit better today.  It is tender.  He denies any drainage from the site.  He has not experienced any fevers or chills.  Past Medical History:  Diagnosis Date  . ADD (attention deficit disorder)   . GERD (gastroesophageal reflux disease)   . Hypertension   . Vitamin D deficiency    Past Surgical History:  Procedure Laterality Date  . KNEE ARTHROSCOPY Left 1998  . none     Family History  Problem Relation Age of Onset  . Arthritis Mother   . Diabetes Mother   . Hypertension Mother   . COPD Father   . Hypertension Father    Social History   Tobacco Use  . Smoking status: Former Smoker    Packs/day: 0.00    Years: 1.00    Pack years: 0.00    Types: Cigars    Quit date: 06/28/2014    Years since quitting: 5.2  . Smokeless tobacco: Never Used  Substance Use Topics  . Alcohol use: Yes    Alcohol/week: 1.0 standard drinks    Types: 1 Cans of beer per week    Comment: rarely  . Drug use: No   Current Meds  Medication Sig  . allopurinol (ZYLOPRIM) 100 MG tablet Take 1 tablet (100 mg total) by mouth daily.  Marland Kitchen amphetamine-dextroamphetamine (ADDERALL XR) 15 MG 24 hr capsule Take 1 capsule by mouth every morning.  Marland Kitchen omeprazole (PRILOSEC) 40 MG capsule Take 1 capsule (40 mg total) by mouth daily.  . valsartan (DIOVAN) 160 MG tablet Take 1 tablet (160 mg total) by mouth daily. In place of hctz  . Vitamin D, Ergocalciferol, (DRISDOL) 1.25 MG (50000 UNIT) CAPS capsule Take 1 capsule (50,000 Units total) by mouth every 7 (seven)  days.   No Known Allergies  Today's Vitals   10/04/19 1112  BP: 121/82  Pulse: 89  Resp: 14  Temp: 97.9 F (36.6 C)  SpO2: 98%  Weight: 216 lb (98 kg)  Height: 6\' 3"  (1.905 m)   Body mass index is 27 kg/m. Physical Exam  Constitutional: He is oriented to person, place, and time. He appears well-developed and well-nourished. No distress.  He is squirming a little bit, suggesting that he is uncomfortable.  HENT:  Head: Normocephalic and atraumatic.  Eyes: Right eye exhibits no discharge. Left eye exhibits no discharge.  Cardiovascular: Normal rate.  Pulmonary/Chest: Effort normal. No stridor. No respiratory distress.  Genitourinary:    Genitourinary Comments: Deferred   Musculoskeletal:        General: No deformity or edema.  Neurological: He is alert and oriented to person, place, and time.  Skin: Skin is warm and dry.     There is a tender area of fluctuance and induration on the left buttock.  Psychiatric: He has a normal mood and affect. His behavior is normal.    Impression and plan: This is a 44 year old man who has had a waxing and waning cyst on his left buttock.  It  is currently inflamed and on physical examination, it does feel as though there is an abscess present.  We will proceed with incision and drainage.  Incision and Drainage Procedure Note Diagnosis: inflamed epidermoid cyst Location: see physical exam Informed Consent: Discussed risks (permanent scarring, light or dark discoloration, infection, pain, bleeding, bruising, redness, damage to adjacent structures, and recurrence of the lesion) and benefits of the procedure, as well as the alternatives.  Informed consent was obtained. Preparation: The area was prepared and draped in a standard fashion. Anesthesia: Lidocaine 1% with epinephrine Procedure Details: An incision was made overlying the lesion. The lesion drained pus, blood and clots.  Pieces of cyst wall were extracted.    Antibiotic ointment and a  sterile pressure dressing were applied. The patient tolerated procedure well. Total number of lesions drained: 1 Plan: The patient was instructed on post-op care. Recommend OTC analgesia as needed for pain.   A culture was taken.  He was prescribed a week's worth of Augmentin.  He was provided with packing supplies and given instructions on how to care for the wound.  I will see him back in 2 weeks time for a wound check.

## 2019-10-07 ENCOUNTER — Other Ambulatory Visit: Payer: Self-pay

## 2019-10-07 ENCOUNTER — Ambulatory Visit: Payer: 59

## 2019-10-07 VITALS — BP 110/78 | HR 81

## 2019-10-07 DIAGNOSIS — I10 Essential (primary) hypertension: Secondary | ICD-10-CM

## 2019-10-07 MED ORDER — VALSARTAN 80 MG PO TABS: 80.0000 mg | ORAL_TABLET | Freq: Every day | ORAL | 0 refills | Status: DC

## 2019-10-07 NOTE — Addendum Note (Signed)
Addended by: Alba Cory F on: 10/07/2019 01:26 PM   Modules accepted: Orders

## 2019-10-07 NOTE — Progress Notes (Signed)
Victor Jackson is here for a blood pressure check. Victor Jackson denies chest pain, palpitations, shortness of breath or visual disturbances. At previous visit blood pressure was 121/82  with a heart rate of 89. Today during nurse visit first check blood pressure was 110/78 and heart rate was 87. He does  take blood pressure medication. Per Dr. Carlynn Purl direction Victor Jackson has been taking half (80mg ) of his valsartan and has not been feeling light headed like before.

## 2019-10-13 ENCOUNTER — Encounter: Payer: Self-pay | Admitting: General Surgery

## 2019-10-13 ENCOUNTER — Other Ambulatory Visit: Payer: Self-pay

## 2019-10-13 ENCOUNTER — Ambulatory Visit (INDEPENDENT_AMBULATORY_CARE_PROVIDER_SITE_OTHER): Payer: Self-pay | Admitting: General Surgery

## 2019-10-13 VITALS — BP 129/81 | HR 78 | Temp 97.1°F | Resp 12 | Ht 75.0 in | Wt 214.0 lb

## 2019-10-13 DIAGNOSIS — L729 Follicular cyst of the skin and subcutaneous tissue, unspecified: Secondary | ICD-10-CM

## 2019-10-13 LAB — RESULT

## 2019-10-13 LAB — SPECIMEN STATUS REPORT

## 2019-10-13 LAB — TISSUE CULTURE AEROBE/ANAEROBE

## 2019-10-13 NOTE — Patient Instructions (Signed)
Continue to pack the area until it closes. Follow up as needed. Call the office if you have any questions or concerns.

## 2019-10-13 NOTE — Progress Notes (Signed)
[name] is here today for a post procedure wound check.  He is a 44 year old man upon whom I performed incision and drainage of a cyst on his left buttock.  He and his wife have been performing dressing changes at home.  He says that it is still tender, but improving.  No fevers or chills.  No nausea or vomiting.  There has been no further purulent drainage from the site.  Today's Vitals   10/13/19 1050  BP: 129/81  Pulse: 78  Resp: 12  Temp: (!) 97.1 F (36.2 C)  SpO2: 97%  Weight: 214 lb (97.1 kg)  Height: 6\' 3"  (1.905 m)  PainSc: 0-No pain   Body mass index is 26.75 kg/m. Focused examination of the surgical site demonstrates that the wound has closed in nicely.  There is some granulation tissue at the base.  There is no erythema, induration, or drainage.  Only a very small amount of packing can be fit into the cavity.  Impression and plan: Mr. Victor Jackson is doing well after incision and drainage of a gluteal cyst.  He and his wife should continue to perform dressing changes until the cavity is flush with the skin.  He should continue to use warm soapy water and clean it daily.  Once it has become flush with the skin, he can just use a clean dressing until it completely epithelializes.  We will see him on an as-needed basis.

## 2019-10-31 ENCOUNTER — Other Ambulatory Visit: Payer: Self-pay | Admitting: Family Medicine

## 2019-10-31 ENCOUNTER — Encounter: Payer: Self-pay | Admitting: Family Medicine

## 2019-10-31 DIAGNOSIS — F902 Attention-deficit hyperactivity disorder, combined type: Secondary | ICD-10-CM

## 2019-10-31 MED ORDER — AMPHETAMINE-DEXTROAMPHET ER 15 MG PO CP24: 15.0000 mg | ORAL_CAPSULE | ORAL | 0 refills | Status: DC

## 2019-11-20 ENCOUNTER — Other Ambulatory Visit: Payer: Self-pay | Admitting: Family Medicine

## 2019-11-20 DIAGNOSIS — I10 Essential (primary) hypertension: Secondary | ICD-10-CM

## 2019-11-20 NOTE — Telephone Encounter (Signed)
Requested medications are due for refill today? Yes  Requested medications are on active medication list? Yes  Last Refill:   10/07/2019  # 90 with no refills  Future visit scheduled?  Yes in 5 days.    Notes to Clinic:  Medication failed RX refill protocol due to no lab work within past 180 days.  Last lab work performed on 04/01/2019.

## 2019-11-21 ENCOUNTER — Other Ambulatory Visit: Payer: Self-pay

## 2019-11-21 DIAGNOSIS — I10 Essential (primary) hypertension: Secondary | ICD-10-CM

## 2019-11-25 ENCOUNTER — Ambulatory Visit: Payer: 59 | Admitting: Family Medicine

## 2019-11-25 ENCOUNTER — Encounter: Payer: Self-pay | Admitting: Family Medicine

## 2019-11-25 ENCOUNTER — Other Ambulatory Visit: Payer: Self-pay

## 2019-11-25 VITALS — BP 140/90 | HR 82 | Temp 97.1°F | Resp 16 | Ht 75.0 in | Wt 213.0 lb

## 2019-11-25 DIAGNOSIS — F902 Attention-deficit hyperactivity disorder, combined type: Secondary | ICD-10-CM

## 2019-11-25 DIAGNOSIS — M109 Gout, unspecified: Secondary | ICD-10-CM

## 2019-11-25 DIAGNOSIS — K219 Gastro-esophageal reflux disease without esophagitis: Secondary | ICD-10-CM

## 2019-11-25 DIAGNOSIS — I10 Essential (primary) hypertension: Secondary | ICD-10-CM | POA: Diagnosis not present

## 2019-11-25 DIAGNOSIS — D696 Thrombocytopenia, unspecified: Secondary | ICD-10-CM

## 2019-11-25 DIAGNOSIS — E790 Hyperuricemia without signs of inflammatory arthritis and tophaceous disease: Secondary | ICD-10-CM

## 2019-11-25 MED ORDER — ALLOPURINOL 100 MG PO TABS: 100.0000 mg | ORAL_TABLET | Freq: Every day | ORAL | 3 refills | Status: DC

## 2019-11-25 MED ORDER — AMPHETAMINE-DEXTROAMPHET ER 15 MG PO CP24: 15.0000 mg | ORAL_CAPSULE | ORAL | 0 refills | Status: DC

## 2019-11-25 MED ORDER — OMEPRAZOLE 40 MG PO CPDR: 40.0000 mg | DELAYED_RELEASE_CAPSULE | Freq: Every day | ORAL | 1 refills | Status: DC

## 2019-11-25 MED ORDER — VALSARTAN 80 MG PO TABS: 80.0000 mg | ORAL_TABLET | Freq: Every day | ORAL | 1 refills | Status: DC

## 2019-11-25 NOTE — Progress Notes (Signed)
Name: Victor Jackson   MRN: [id number]    DOB: July 30, 1975   Date:11/25/2019       Progress Note  Subjective  Chief Complaint  Chief Complaint  Patient presents with  . Follow-up    HPI  HTN: he has has history of hypertension but at one point secondary to stimulants, however he has gained weight over the years, and is now 44 plus and inactive. His bp was elevated when he visit the dentist this Summer and also during his visit with me Fall 2020, we started him on HCTZ and bp has been at goal since, however because of history of gout and high uric acid we will switch to Diovan, initially he was on 160 mg but it caused symptomatic orthostatic changes. He is now on Diovan 80, bp on previous CMA checks were normal, today is slightly up, he has been out of Adderal for the past few weeks, we will continue to monitor for now  Gout: hehad two episodes of acute onset of left ankle pain, swelling but no redness.No symptoms in a while.Last uric acid was 9.2 and he has been taking Allopurinol since October and denies any side effects.He is off hctz and no gout flares   GERD: taking PPI, no heartburn or indigestion, even though he has been eating more junk, symptoms has resolved, unchanged   Dyslipidemia: discussed life style modification , we will continue to monitor   The 10-year ASCVD risk score Denman George DC Montez Hageman., et al., 2013) is: 7.9%   Values used to calculate the score:     Age: 44 years     Sex: Male     Is Non-Hispanic African American: Yes     Diabetic: No     Tobacco smoker: No     Systolic Blood Pressure: 140 mmHg     Is BP treated: Yes     HDL Cholesterol: 51 mg/dL     Total Cholesterol: 189 mg/dL  ADHD: son has ADHD, he was diagnosed at the time that his son was diagnosed with the condition. He responded well to Adderal in the past, but had a gap in insurance and was  off medication for a long time.Since bp was at goal in Dec 2020 we sent rx of Adderal but it took 3 months for it  to be approved, he resumed medication in March, he came in for bp check and it was in the 120's/80's multiple times, including during recent visit to general surgeon. 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 He called pharmacy and they said he did not have a rx, we contact [facility] while he was here and the rx is ready for him now.   Thrombocytopenia: improved on his last visit, we will recheck next visit, no easy bruising, had a procedure recently without problems  Cyst Buttocks: had it removed by Dr. Lady Gary, he states pain has resolved.    Patient Active Problem List   Diagnosis Date Noted  . Pre-diabetes 08/19/2019  . Dyslipidemia 08/19/2019  . Essential hypertension, benign 08/19/2019  . Pes planus 09/11/2016  . Capsulitis of left foot 09/11/2016  . Controlled gout 05/02/2016  . Umbilical hernia 11/27/2015  . GERD (gastroesophageal reflux disease) 11/27/2015  . Thrombocytopenia (HCC) 11/27/2015  . Vitamin D deficiency 11/27/2015  . History of hypertension 11/27/2015  . ADD (attention deficit disorder) 11/27/2015  .  History of hand fracture     Past Surgical History:  Procedure Laterality Date  . KNEE ARTHROSCOPY Left 1998  . none      Family History  Problem Relation Age of Onset  . Arthritis Mother   . Diabetes Mother   . Hypertension Mother   . COPD Father   . Hypertension Father     Social History   Tobacco Use  . Smoking status: Former Smoker    Packs/day: 0.00    Years: 1.00    Pack years: 0.00    Types: Cigars    Quit date: 06/28/2014    Years since quitting: 5.4  . Smokeless tobacco: Never Used  Substance Use Topics  . Alcohol use: Yes    Alcohol/week: 1.0 standard drink    Types: 1 Cans of beer per week    Comment: rarely     Current Outpatient Medications:  .  allopurinol (ZYLOPRIM) 100 MG tablet, Take 1 tablet (100 mg total) by mouth  daily., Disp: 90 tablet, Rfl: 3 .  amphetamine-dextroamphetamine (ADDERALL XR) 15 MG 24 hr capsule, Take 1 capsule by mouth every morning., Disp: 30 capsule, Rfl: 0 .  omeprazole (PRILOSEC) 40 MG capsule, Take 1 capsule (40 mg total) by mouth daily., Disp: 90 capsule, Rfl: 1 .  valsartan (DIOVAN) 80 MG tablet, Take 1 tablet (80 mg total) by mouth daily., Disp: 90 tablet, Rfl: 1 .  Vitamin D, Ergocalciferol, (DRISDOL) 1.25 MG (50000 UNIT) CAPS capsule, Take 1 capsule (50,000 Units total) by mouth every 7 (seven) days., Disp: 12 capsule, Rfl: 1 .  amphetamine-dextroamphetamine (ADDERALL XR) 15 MG 24 hr capsule, Take 1 capsule by mouth every morning., Disp: 30 capsule, Rfl: 0  No Known Allergies  I personally reviewed active problem list, medication list, allergies, family history, social history, health maintenance with the patient/caregiver today.   ROS  Constitutional: Negative for fever or weight change.  Respiratory: Negative for cough and shortness of breath.   Cardiovascular: Negative for chest pain or palpitations.  Gastrointestinal: Negative for abdominal pain, no bowel changes.  Musculoskeletal: Negative for gait problem or joint swelling.  Skin: Negative for rash.  Neurological: Negative for dizziness or headache.  No other specific complaints in a complete review of systems (except as listed in HPI above).  Objective  Vitals:   11/25/19 1504 11/25/19 1632  BP: (!) 130/100 140/90  Pulse: 82   Resp: 16   Temp: (!) 97.1 F (36.2 C)   TempSrc: Temporal   SpO2: 96%   Weight: 213 lb (96.6 kg)   Height: 6\' 3"  (1.905 m)     Body mass index is 26.62 kg/m.  Physical Exam  Constitutional: Patient appears well-developed and well-nourished. No distress.  HEENT: head atraumatic, normocephalic, pupils equal and reactive to light, neck supple, throat within normal limits Cardiovascular: Normal rate, regular rhythm and normal heart sounds.  No murmur heard. No BLE  edema. Pulmonary/Chest: Effort normal and breath sounds normal. No respiratory distress. Abdominal: Soft.  There is no tenderness. Psychiatric: Patient has a normal mood and affect. behavior is normal. Judgment and thought content normal.  Recent Results (from the past 2160 hour(s))  Tissue Culture Aerobe/Anaerobe     Status: Abnormal   Collection Time: 10/04/19 11:31 AM   TISSUE  Result Value Ref Range   Anaerobic Cult, Extended Incub Final report (A)    Tissue Culture Final report   Result     Status: Abnormal   Collection Time: 10/04/19 11:31 AM  Result Value Ref Range   Result 1 Actinomyces israelii (A)     Comment: Light growth   Result 2 Cutibacterium avidum (A)     Comment: Scant growth  Result     Status: None   Collection Time: 10/04/19 11:31 AM  Result Value Ref Range   Result 1 Comment     Comment: No growth in 56 - 72 hours.  Specimen status report     Status: None   Collection Time: 10/04/19 11:31 AM  Result Value Ref Range   specimen status report Comment     Comment: Test Code Change Test Code Change Please note that the Microbiology test code was changed to reflect the specimen source or transport received.      PHQ2/9: Depression screen [facility] 2/9 11/25/2019 08/19/2019 05/20/2019 04/01/2019 09/11/2016  Decreased Interest 0 0 0 1 0  Down, Depressed, Hopeless 0 0 0 1 0  PHQ - 2 Score 0 0 0 2 0  Altered sleeping 0 0 0 1 -  Tired, decreased energy 0 0 0 1 -  Change in appetite 0 0 0 0 -  Feeling bad or failure about yourself  0 0 0 0 -  Trouble concentrating 0 0 0 3 -  Moving slowly or fidgety/restless 0 0 0 0 -  Suicidal thoughts 0 0 0 0 -  PHQ-9 Score 0 0 0 7 -  Difficult doing work/chores - Not difficult at all - Not difficult at all -    phq 9 is negative   Fall Risk: Fall Risk  11/25/2019 10/13/2019 08/19/2019 05/20/2019 04/01/2019  Falls in the past year? 0 0 0 0 0  Number falls in past yr: 0 - 0 0 0  Injury with Fall? 0 - 0 0 0     Functional  Status Survey: Is the patient deaf or have difficulty hearing?: No Does the patient have difficulty seeing, even when wearing glasses/contacts?: No Does the patient have difficulty concentrating, remembering, or making decisions?: No Does the patient have difficulty walking or climbing stairs?: No Does the patient have difficulty dressing or bathing?: No Does the patient have difficulty doing errands alone such as visiting a doctor's office or shopping?: No    Assessment & Plan  1. Elevated uric acid in blood  - allopurinol (ZYLOPRIM) 100 MG tablet; Take 1 tablet (100 mg total) by mouth daily.  Dispense: 90 tablet; Refill: 3  2. Essential hypertension, benign  - valsartan (DIOVAN) 80 MG tablet; Take 1 tablet (80 mg total) by mouth daily.  Dispense: 90 tablet; Refill: 1  BP is elevated today, but  3. Gastroesophageal reflux disease without esophagitis  - omeprazole (PRILOSEC) 40 MG capsule; Take 1 capsule (40 mg total) by mouth daily.  Dispense: 90 capsule; Refill: 1  4. Attention deficit hyperactivity disorder (ADHD), combined type  - amphetamine-dextroamphetamine (ADDERALL XR) 15 MG 24 hr capsule; Take 1 capsule by mouth every morning.  Dispense: 30 capsule; Refill: 0 - amphetamine-dextroamphetamine (ADDERALL XR) 15 MG 24 hr capsule; Take 1 capsule by mouth every morning.  Dispense: 30 capsule; Refill: 0  5. Controlled gout  Doing well , no recent episodes    6. Thrombocytopenia (HCC)  Recheck next visit

## 2020-01-02 ENCOUNTER — Encounter: Payer: Self-pay | Admitting: Family Medicine

## 2020-01-02 ENCOUNTER — Other Ambulatory Visit: Payer: Self-pay

## 2020-01-02 DIAGNOSIS — F902 Attention-deficit hyperactivity disorder, combined type: Secondary | ICD-10-CM

## 2020-01-02 MED ORDER — AMPHETAMINE-DEXTROAMPHET ER 15 MG PO CP24: 15.0000 mg | ORAL_CAPSULE | ORAL | 0 refills | Status: DC

## 2020-02-09 ENCOUNTER — Encounter: Payer: Self-pay | Admitting: Family Medicine

## 2020-02-09 ENCOUNTER — Telehealth: Payer: Self-pay | Admitting: Family Medicine

## 2020-02-09 DIAGNOSIS — E559 Vitamin D deficiency, unspecified: Secondary | ICD-10-CM

## 2020-02-09 DIAGNOSIS — F902 Attention-deficit hyperactivity disorder, combined type: Secondary | ICD-10-CM

## 2020-02-09 MED ORDER — AMPHETAMINE-DEXTROAMPHET ER 15 MG PO CP24: 15.0000 mg | ORAL_CAPSULE | ORAL | 0 refills | Status: DC

## 2020-02-09 NOTE — Telephone Encounter (Signed)
Requested medication (s) are due for refill today: yes  Requested medication (s) are on the active medication list: yes  Last refill: 11/18/2019  Future visit scheduled: no  Notes to clinic:  50,000 IU strengths are not delegated   Requested Prescriptions  Pending Prescriptions Disp Refills   Vitamin D, Ergocalciferol, (DRISDOL) 1.25 MG (50000 UNIT) CAPS capsule [Pharmacy Med Name: VITAMIN D2 1.25MG (50,000 UNIT)] 12 capsule 1    Sig: Take 1 capsule (50,000 Units total) by mouth every 7 (seven) days.      Endocrinology:  Vitamins - Vitamin D Supplementation Failed - 02/09/2020  8:30 AM      Failed - 50,000 IU strengths are not delegated      Failed - Phosphate in normal range and within 360 days    No results found for: PHOS        Failed - Vitamin D in normal range and within 360 days    Vit D, 25-Hydroxy  Date Value Ref Range Status  04/01/2019 17 (L) 30 - 100 ng/mL Final    Comment:    Vitamin D Status         25-OH Vitamin D: . Deficiency:                    <20 ng/mL Insufficiency:             20 - 29 ng/mL Optimal:                 > or = 30 ng/mL . For 25-OH Vitamin D testing on patients on  D2-supplementation and patients for whom quantitation  of D2 and D3 fractions is required, the [facility](TM) 25-OH VIT D, (D2,D3), LC/MS/MS is recommended: order  code 41324 (patients >73yrs). See Note 1 . Note 1 . For additional information, please refer to  http://education.[facility].com/faq/FAQ199  (This link is being provided for informational/ educational purposes only.)           Passed - Ca in normal range and within 360 days    Calcium  Date Value Ref Range Status  04/01/2019 10.0 8.6 - 10.3 mg/dL Final          Passed - Valid encounter within last 12 months    Recent Outpatient Visits           2 months ago Attention deficit hyperactivity disorder (ADHD), combined type   [facility] Alba Cory, MD   5 months ago  Dyslipidemia   [facility] Alba Cory, MD   8 months ago Essential hypertension, benign   [facility] Alba Cory, MD   10 months ago Needs flu shot   [facility] [employer], Danna Hefty, MD   3 years ago Gastroesophageal reflux disease without esophagitis   [facility] Alba Cory, MD

## 2020-02-10 NOTE — Telephone Encounter (Signed)
Tried to call pt to get him scheduled and his phone kept dropping the call

## 2020-03-14 ENCOUNTER — Other Ambulatory Visit: Payer: Self-pay | Admitting: Family Medicine

## 2020-03-14 DIAGNOSIS — F902 Attention-deficit hyperactivity disorder, combined type: Secondary | ICD-10-CM

## 2020-03-14 NOTE — Telephone Encounter (Signed)
Requested medication (s) are due for refill today:  Yes  Requested medication (s) are on the active medication list:  Yes  Future visit scheduled:  Yes  Last Refill: 02/09/20; #30/ 0 refills  Notes to clinic: not delegated.  Requested Prescriptions  Pending Prescriptions Disp Refills   amphetamine-dextroamphetamine (ADDERALL XR) 15 MG 24 hr capsule 30 capsule 0    Sig: Take 1 capsule by mouth every morning.      Not Delegated - Psychiatry:  Stimulants/ADHD Failed - 03/14/2020 12:47 PM      Failed - This refill cannot be delegated      Failed - Urine Drug Screen completed in last 360 days.      Failed - Valid encounter within last 3 months    Recent Outpatient Visits           3 months ago Attention deficit hyperactivity disorder (ADHD), combined type   [facility] Alba Cory, MD   6 months ago Dyslipidemia   [facility] Alba Cory, MD   9 months ago Essential hypertension, benign   [facility] Alba Cory, MD   11 months ago Needs flu shot   [facility], Danna Hefty, MD   3 years ago Gastroesophageal reflux disease without esophagitis   [facility] Alba Cory, MD       Future Appointments             In 1 month Carlynn Purl, Danna Hefty, MD [facility]

## 2020-03-14 NOTE — Telephone Encounter (Signed)
Patient requesting amphetamine-dextroamphetamine (ADDERALL XR) 15 MG 24 hr capsule, informed patient please allow 48 to 72 hour turn aeround time. . Patient scheduled next available for 04/17/2020

## 2020-04-16 NOTE — Progress Notes (Signed)
Name: Victor Jackson   MRN: [id number]    DOB: 04-03-1976   Date:04/17/2020       Progress Note  Subjective  Chief Complaint  Follow up/Medication Refill  HPI    HTN: he has has history of hypertension but at one point secondary to stimulants, however he has gained weight over the years, and is now 44 plus and inactive. His bp was elevated when he visit the dentist this Summer and also during his visit with me Fall 2020, we started him on HCTZ and bp was at goal,  however because of history of gout and high uric acid we will switch to Diovan, initially he was on 160 mg but it caused symptomatic orthostatic changes. He is now on Diovan 80, today bp is high again, we will try to change from Diovan 80 mg to norvasc 2.5 mg to see if controls bp without causing orthostatic changes   Gout: Last uric acid was 9.2 , we will recheck level. Taking Allopurinol daily and we will adjust dose if needed. No recent gout attacks  GERD: taking PPI, no heartburn or indigestion, he states cutting down on junk food, avoiding fried food  Dyslipidemia: discussed life style modification , we will continue to monitor   The 10-year ASCVD risk score Denman George DC Montez Hageman., et al., 2013) is: 7.7%   Values used to calculate the score:     Age: 44 years     Sex: Male     Is Non-Hispanic African American: Yes     Diabetic: No     Tobacco smoker: No     Systolic Blood Pressure: 138 mmHg     Is BP treated: Yes     HDL Cholesterol: 51 mg/dL     Total Cholesterol: 189 mg/dL  ADHD: son has ADHD, he was diagnosed at the time that his son was diagnosed with this  Condition about 12 years ago. He states medication helps him focus and accomplish tasks at work, he takes medications on weekends also but does not need to take it.   Thrombocytopenia: we will recheck labs, denies easy bruising   Patient Active Problem List   Diagnosis Date Noted  . Pre-diabetes 08/19/2019  . Dyslipidemia 08/19/2019  . Essential hypertension,  benign 08/19/2019  . Pes planus 09/11/2016  . Capsulitis of left foot 09/11/2016  . Controlled gout 05/02/2016  . Umbilical hernia 11/27/2015  . GERD (gastroesophageal reflux disease) 11/27/2015  . Thrombocytopenia (HCC) 11/27/2015  . Vitamin D deficiency 11/27/2015  . History of hypertension 11/27/2015  . ADD (attention deficit disorder) 11/27/2015  . History of hand fracture     Past Surgical History:  Procedure Laterality Date  . KNEE ARTHROSCOPY Left 1998  . none      Family History  Problem Relation Age of Onset  . Arthritis Mother   . Diabetes Mother   . Hypertension Mother   . COPD Father   . Hypertension Father     Social History   Tobacco Use  . Smoking status: Former Smoker    Packs/day: 0.00    Years: 1.00    Pack years: 0.00    Types: Cigars    Quit date: 06/28/2014    Years since quitting: 5.8  . Smokeless tobacco: Never Used  Substance Use Topics  . Alcohol use: Yes    Alcohol/week: 1.0 standard drink    Types: 1 Cans of beer per week    Comment: rarely     Current Outpatient Medications:  .  allopurinol (ZYLOPRIM) 100 MG tablet, Take 1 tablet (100 mg total) by mouth daily., Disp: 90 tablet, Rfl: 3 .  amphetamine-dextroamphetamine (ADDERALL XR) 15 MG 24 hr capsule, Take 1 capsule by mouth every morning., Disp: 30 capsule, Rfl: 0 .  omeprazole (PRILOSEC) 40 MG capsule, Take 1 capsule (40 mg total) by mouth daily., Disp: 90 capsule, Rfl: 1 .  valsartan (DIOVAN) 80 MG tablet, Take 1 tablet (80 mg total) by mouth daily., Disp: 90 tablet, Rfl: 1 .  Vitamin D, Ergocalciferol, (DRISDOL) 1.25 MG (50000 UNIT) CAPS capsule, TAKE 1 CAPSULE (50,000 UNITS TOTAL) BY MOUTH EVERY 7 (SEVEN) DAYS., Disp: 12 capsule, Rfl: 1  No Known Allergies  I personally reviewed active problem list, medication list, allergies, family history, social history, health maintenance, notes from last encounter with the patient/caregiver today.   ROS  Ten systems reviewed and is  negative except as mentioned in HPI   Objective  Vitals:   04/17/20 1508 04/17/20 1512  BP: (!) 140/92 138/90  Pulse: 73   Resp: 18   Temp: 98.7 F (37.1 C)   TempSrc: Oral   SpO2: 99%   Weight: 211 lb 3.2 oz (95.8 kg)   Height: 6\' 3"  (1.905 m)     Body mass index is 26.4 kg/m.  Physical Exam  Constitutional: Patient appears well-developed and well-nourished. No distress.  HEENT: head atraumatic, normocephalic, pupils equal and reactive to light, neck supple Cardiovascular: Normal rate, regular rhythm and normal heart sounds.  No murmur heard. No BLE edema. Pulmonary/Chest: Effort normal and breath sounds normal. No respiratory distress. Abdominal: Soft.  There is no tenderness. Psychiatric: Patient has a normal mood and affect. behavior is normal. Judgment and thought content  normal. PHQ2/9: Depression screen [facility] 2/9 04/17/2020 11/25/2019 08/19/2019 05/20/2019 04/01/2019  Decreased Interest 0 0 0 0 1  Down, Depressed, Hopeless 0 0 0 0 1  PHQ - 2 Score 0 0 0 0 2  Altered sleeping - 0 0 0 1  Tired, decreased energy - 0 0 0 1  Change in appetite - 0 0 0 0  Feeling bad or failure about yourself  - 0 0 0 0  Trouble concentrating - 0 0 0 3  Moving slowly or fidgety/restless - 0 0 0 0  Suicidal thoughts - 0 0 0 0  PHQ-9 Score - 0 0 0 7  Difficult doing work/chores - - Not difficult at all - Not difficult at all    phq 9 is negative  Fall Risk: Fall Risk  04/17/2020 11/25/2019 10/13/2019 08/19/2019 05/20/2019  Falls in the past year? 0 0 0 0 0  Number falls in past yr: 0 0 - 0 0  Injury with Fall? 0 0 - 0 0    Functional Status Survey: Is the patient deaf or have difficulty hearing?: No Does the patient have difficulty seeing, even when wearing glasses/contacts?: No Does the patient have difficulty concentrating, remembering, or making decisions?: No Does the patient have difficulty walking or climbing stairs?: No Does the patient have difficulty dressing or bathing?:  No Does the patient have difficulty doing errands alone such as visiting a doctor's office or shopping?: No    Assessment & Plan  1. Need for hepatitis C screening test  - Hepatitis C Antibody  2. Attention deficit hyperactivity disorder (ADHD), combined type  - amphetamine-dextroamphetamine (ADDERALL XR) 15 MG 24 hr capsule; Take 1 capsule by mouth every morning.  Dispense: 30 capsule; Refill: 0  3. Essential hypertension, benign  -  amLODipine (NORVASC) 2.5 MG tablet; Take 1 tablet (2.5 mg total) by mouth daily.  Dispense: 30 tablet; Refill: 0 - CBC with Differential/Platelet - COMPLETE METABOLIC PANEL WITH GFR  4. Vitamin D deficiency  - VITAMIN D 25 Hydroxy (Vit-D Deficiency, Fractures)  5. Controlled gout  - Uric acid  6. Gastroesophageal reflux disease without esophagitis   7. Pre-diabetes  - Hemoglobin A1c  8. Dyslipidemia  - Lipid panel  9. Thrombocytopenia (HCC)  - CBC with Differential/Platelet  10. Lipid screening  - Lipid panel

## 2020-04-17 ENCOUNTER — Encounter: Payer: Self-pay | Admitting: Family Medicine

## 2020-04-17 ENCOUNTER — Ambulatory Visit: Payer: 59 | Admitting: Family Medicine

## 2020-04-17 ENCOUNTER — Other Ambulatory Visit: Payer: Self-pay

## 2020-04-17 VITALS — BP 138/90 | HR 73 | Temp 98.7°F | Resp 18 | Ht 75.0 in | Wt 211.2 lb

## 2020-04-17 DIAGNOSIS — R7303 Prediabetes: Secondary | ICD-10-CM

## 2020-04-17 DIAGNOSIS — E785 Hyperlipidemia, unspecified: Secondary | ICD-10-CM

## 2020-04-17 DIAGNOSIS — F902 Attention-deficit hyperactivity disorder, combined type: Secondary | ICD-10-CM | POA: Diagnosis not present

## 2020-04-17 DIAGNOSIS — D696 Thrombocytopenia, unspecified: Secondary | ICD-10-CM

## 2020-04-17 DIAGNOSIS — M109 Gout, unspecified: Secondary | ICD-10-CM

## 2020-04-17 DIAGNOSIS — I10 Essential (primary) hypertension: Secondary | ICD-10-CM

## 2020-04-17 DIAGNOSIS — Z1159 Encounter for screening for other viral diseases: Secondary | ICD-10-CM | POA: Diagnosis not present

## 2020-04-17 DIAGNOSIS — E559 Vitamin D deficiency, unspecified: Secondary | ICD-10-CM

## 2020-04-17 DIAGNOSIS — Z13220 Encounter for screening for lipoid disorders: Secondary | ICD-10-CM

## 2020-04-17 DIAGNOSIS — K219 Gastro-esophageal reflux disease without esophagitis: Secondary | ICD-10-CM

## 2020-04-17 MED ORDER — AMPHETAMINE-DEXTROAMPHET ER 15 MG PO CP24: 15.0000 mg | ORAL_CAPSULE | ORAL | 0 refills | Status: DC

## 2020-04-17 MED ORDER — AMLODIPINE BESYLATE 2.5 MG PO TABS: 2.5000 mg | ORAL_TABLET | Freq: Every day | ORAL | 0 refills | Status: DC

## 2020-04-18 LAB — CBC WITH DIFFERENTIAL/PLATELET
Absolute Monocytes: 545 cells/uL (ref 200–950)
Basophils Absolute: 29 cells/uL (ref 0–200)
Basophils Relative: 0.5 %
Eosinophils Absolute: 17 cells/uL (ref 15–500)
Eosinophils Relative: 0.3 %
HCT: 44.3 % (ref 38.5–50.0)
Hemoglobin: 15.3 g/dL (ref 13.2–17.1)
Lymphs Abs: 2210 cells/uL (ref 850–3900)
MCH: 30.3 pg (ref 27.0–33.0)
MCHC: 34.5 g/dL (ref 32.0–36.0)
MCV: 87.7 fL (ref 80.0–100.0)
MPV: 13.5 fL — ABNORMAL HIGH (ref 7.5–12.5)
Monocytes Relative: 9.4 %
Neutro Abs: 2999 cells/uL (ref 1500–7800)
Neutrophils Relative %: 51.7 %
Platelets: 125 10*3/uL — ABNORMAL LOW (ref 140–400)
RBC: 5.05 10*6/uL (ref 4.20–5.80)
RDW: 12.5 % (ref 11.0–15.0)
Total Lymphocyte: 38.1 %
WBC: 5.8 10*3/uL (ref 3.8–10.8)

## 2020-04-18 LAB — HEMOGLOBIN A1C
Hgb A1c MFr Bld: 5.5 % of total Hgb (ref <5.7)
Mean Plasma Glucose: 111 (calc)
eAG (mmol/L): 6.2 (calc)

## 2020-04-18 LAB — VITAMIN D 25 HYDROXY (VIT D DEFICIENCY, FRACTURES): Vit D, 25-Hydroxy: 87 ng/mL (ref 30–100)

## 2020-04-18 LAB — COMPLETE METABOLIC PANEL WITH GFR
AG Ratio: 1.9 (calc) (ref 1.0–2.5)
ALT: 19 U/L (ref 9–46)
AST: 17 U/L (ref 10–40)
Albumin: 4.6 g/dL (ref 3.6–5.1)
Alkaline phosphatase (APISO): 55 U/L (ref 36–130)
BUN: 13 mg/dL (ref 7–25)
CO2: 28 mmol/L (ref 20–32)
Calcium: 9.9 mg/dL (ref 8.6–10.3)
Chloride: 104 mmol/L (ref 98–110)
Creat: 1.15 mg/dL (ref 0.60–1.35)
GFR, Est African American: 89 mL/min/{1.73_m2} (ref >=60)
GFR, Est Non African American: 76 mL/min/{1.73_m2} (ref >=60)
Globulin: 2.4 g/dL (calc) (ref 1.9–3.7)
Glucose, Bld: 85 mg/dL (ref 65–99)
Potassium: 4.4 mmol/L (ref 3.5–5.3)
Sodium: 140 mmol/L (ref 135–146)
Total Bilirubin: 0.8 mg/dL (ref 0.2–1.2)
Total Protein: 7 g/dL (ref 6.1–8.1)

## 2020-04-18 LAB — LIPID PANEL
Cholesterol: 145 mg/dL (ref <200)
HDL: 51 mg/dL (ref >=40)
LDL Cholesterol (Calc): 76 mg/dL (calc)
Non-HDL Cholesterol (Calc): 94 mg/dL (calc) (ref <130)
Total CHOL/HDL Ratio: 2.8 (calc) (ref <5.0)
Triglycerides: 94 mg/dL (ref <150)

## 2020-04-18 LAB — HEPATITIS C ANTIBODY
Hepatitis C Ab: NONREACTIVE
SIGNAL TO CUT-OFF: 0.05 (ref <1.00)

## 2020-04-18 LAB — URIC ACID: Uric Acid, Serum: 6.2 mg/dL (ref 4.0–8.0)

## 2020-04-24 ENCOUNTER — Ambulatory Visit: Payer: 59

## 2020-04-24 ENCOUNTER — Other Ambulatory Visit: Payer: Self-pay

## 2020-04-24 ENCOUNTER — Telehealth: Payer: Self-pay

## 2020-04-24 VITALS — BP 148/92

## 2020-04-24 DIAGNOSIS — Z0130 Encounter for examination of blood pressure without abnormal findings: Secondary | ICD-10-CM

## 2020-05-03 ENCOUNTER — Other Ambulatory Visit: Payer: Self-pay

## 2020-05-03 ENCOUNTER — Ambulatory Visit (INDEPENDENT_AMBULATORY_CARE_PROVIDER_SITE_OTHER): Payer: 59

## 2020-05-03 VITALS — BP 128/76 | HR 86

## 2020-05-03 DIAGNOSIS — Z0130 Encounter for examination of blood pressure without abnormal findings: Secondary | ICD-10-CM

## 2020-05-03 NOTE — Progress Notes (Signed)
Patient is here for a blood pressure check. Patient denies chest pain, palpitations, shortness of breath or visual disturbances. At previous visit blood pressure was 148/92. Today during nurse visit first check blood pressure was 128/76 with a heart rate of 86. He has been taking medication as prescribed with no missed doses.

## 2020-05-07 ENCOUNTER — Other Ambulatory Visit: Payer: Self-pay | Admitting: Family Medicine

## 2020-05-07 ENCOUNTER — Encounter: Payer: Self-pay | Admitting: Family Medicine

## 2020-05-07 DIAGNOSIS — I10 Essential (primary) hypertension: Secondary | ICD-10-CM

## 2020-05-07 MED ORDER — AMLODIPINE BESYLATE 5 MG PO TABS: 5.0000 mg | ORAL_TABLET | Freq: Every day | ORAL | 0 refills | Status: DC

## 2020-05-09 ENCOUNTER — Other Ambulatory Visit: Payer: Self-pay | Admitting: Family Medicine

## 2020-05-09 DIAGNOSIS — I10 Essential (primary) hypertension: Secondary | ICD-10-CM

## 2020-05-17 ENCOUNTER — Other Ambulatory Visit: Payer: Self-pay | Admitting: Emergency Medicine

## 2020-05-17 ENCOUNTER — Encounter: Payer: Self-pay | Admitting: Family Medicine

## 2020-05-17 DIAGNOSIS — F902 Attention-deficit hyperactivity disorder, combined type: Secondary | ICD-10-CM

## 2020-05-17 MED ORDER — AMPHETAMINE-DEXTROAMPHET ER 15 MG PO CP24: 15.0000 mg | ORAL_CAPSULE | ORAL | 0 refills | Status: DC

## 2020-05-18 ENCOUNTER — Other Ambulatory Visit: Payer: Self-pay

## 2020-05-18 DIAGNOSIS — F902 Attention-deficit hyperactivity disorder, combined type: Secondary | ICD-10-CM

## 2020-05-25 NOTE — Telephone Encounter (Signed)
completed

## 2020-05-29 ENCOUNTER — Other Ambulatory Visit: Payer: Self-pay | Admitting: Family Medicine

## 2020-05-29 DIAGNOSIS — K219 Gastro-esophageal reflux disease without esophagitis: Secondary | ICD-10-CM

## 2020-06-21 ENCOUNTER — Encounter: Payer: Self-pay | Admitting: Family Medicine

## 2020-06-21 ENCOUNTER — Other Ambulatory Visit: Payer: Self-pay | Admitting: Family Medicine

## 2020-06-21 DIAGNOSIS — F902 Attention-deficit hyperactivity disorder, combined type: Secondary | ICD-10-CM

## 2020-06-21 MED ORDER — AMPHETAMINE-DEXTROAMPHET ER 15 MG PO CP24: 15.0000 mg | ORAL_CAPSULE | ORAL | 0 refills | Status: DC

## 2020-07-27 NOTE — Progress Notes (Signed)
Name: Victor Jackson   MRN: [id number]    DOB: 1975-06-27   Date:07/30/2020       Progress Note  Subjective  Chief Complaint  Medication Refill  HPI  HTN: he has has history of hypertension but at one point secondary to stimulants, however he has gained weight over the years, and is now 39 plus and inactive. His bp was elevated when he visit the dentist this Summer and also during his visit with me Fall 2020, we started him on HCTZ and bp was at goal,  however because of history of gout and high uric acid we will switch to Diovan, it caused some orthostatic changes but lower dose did not work, he is now on Norvasc 5 mg daily and no side effects. He states this past Saturday he was having dinner and developed a pulsating sensation on his throat, like if his heart beat was slow. He felt tired but no chest pain or diaphoresis, no nausea or vomiting. Symptoms lasted about 30 minutes and resolved by itself. He had to similar episodes in the past 6 weeks or so. 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   GERD: taking PPI, no heartburn or indigestion since taking medications daily   Dyslipidemia:discussed life style modification  The 10-year ASCVD risk score Denman George DC Jr., et al., 2013) is: 6%   Values used to calculate the score:     Age: 45 years     Sex: Male     Is Non-Hispanic African American: Yes     Diabetic: No     Tobacco smoker: No     Systolic Blood Pressure: 122 mmHg     Is BP treated: Yes     HDL Cholesterol: 51 mg/dL     Total Cholesterol: 145 mg/dL   ADHD: son has ADHD, he was  diagnosed at the time that his son was diagnosed with this  Condition about 12 years ago. He states medication helps him focus and accomplish tasks at work, he takes medications on weekends also but does not need to take it. We will give him a 90 day supply   Thrombocytopenia: last level stable , denies easy bruising    Weight loss: actively trying to get below 200 lbs. He has been more active, coaching basketball, less time to eat now, also cutting down on portions   Patient Active Problem List   Diagnosis Date Noted  . Pre-diabetes 08/19/2019  . Dyslipidemia 08/19/2019  . Essential hypertension, benign 08/19/2019  . Pes planus 09/11/2016  . Capsulitis of left foot 09/11/2016  . Controlled gout 05/02/2016  . Umbilical hernia 11/27/2015  . GERD (gastroesophageal reflux disease) 11/27/2015  . Thrombocytopenia (HCC) 11/27/2015  . Vitamin D deficiency 11/27/2015  . History of hypertension 11/27/2015  . ADD (attention deficit disorder) 11/27/2015  . History of hand fracture     Past Surgical History:  Procedure Laterality Date  . KNEE ARTHROSCOPY Left 1998  . none      Family History  Problem Relation Age of Onset  . Arthritis Mother   .  Diabetes Mother   . Hypertension Mother   . COPD Father   . Hypertension Father     Social History   Tobacco Use  . Smoking status: Former Smoker    Packs/day: 0.00    Years: 1.00    Pack years: 0.00    Types: Cigars    Quit date: 06/28/2014    Years since quitting: 6.0  . Smokeless tobacco: Never Used  Substance Use Topics  . Alcohol use: Yes    Alcohol/week: 1.0 standard drink    Types: 1 Cans of beer per week    Comment: rarely     Current Outpatient Medications:  .  allopurinol (ZYLOPRIM) 100 MG tablet, Take 1 tablet (100 mg total) by mouth daily., Disp: 90 tablet, Rfl: 3 .  amLODipine (NORVASC) 5 MG tablet, TAKE 1 TABLET BY MOUTH EVERY DAY, Disp: 90 tablet, Rfl: 0 .  amphetamine-dextroamphetamine (ADDERALL XR) 15 MG 24 hr  capsule, Take 1 capsule by mouth every morning., Disp: 30 capsule, Rfl: 0 .  amphetamine-dextroamphetamine (ADDERALL XR) 15 MG 24 hr capsule, Take 1 capsule by mouth every morning., Disp: 30 capsule, Rfl: 0 .  omeprazole (PRILOSEC) 40 MG capsule, TAKE 1 CAPSULE BY MOUTH EVERY DAY, Disp: 90 capsule, Rfl: 2 .  amphetamine-dextroamphetamine (ADDERALL XR) 15 MG 24 hr capsule, Take 1 capsule by mouth every morning., Disp: 30 capsule, Rfl: 0 .  Vitamin D, Ergocalciferol, (DRISDOL) 1.25 MG (50000 UNIT) CAPS capsule, Take 1 capsule (50,000 Units total) by mouth every 7 (seven) days., Disp: 12 capsule, Rfl: 1  No Known Allergies  I personally reviewed active problem list, medication list, allergies, family history, social history, health maintenance with the patient/caregiver today.   ROS  Constitutional: Negative for fever , positive for weight change.  Respiratory: Negative for cough and shortness of breath.   Cardiovascular: Negative for chest pain , positive for palpitations.  Gastrointestinal: Negative for abdominal pain, no bowel changes.  Musculoskeletal: Negative for gait problem or joint swelling.  Skin: Negative for rash.  Neurological: Negative for dizziness or headache.  No other specific complaints in a complete review of systems (except as listed in HPI above).  Objective  Vitals:   07/30/20 1436  BP: 122/86  Pulse: 89  Resp: 18  Temp: 98.4 F (36.9 C)  TempSrc: Oral  SpO2: 98%  Weight: 203 lb 4.8 oz (92.2 kg)  Height: 6\' 3"  (1.905 m)    Body mass index is 25.41 kg/m.  Physical Exam  Constitutional: Patient appears well-developed and well-nourished. No distress.  HEENT: head atraumatic, normocephalic, pupils equal and reactive to light,  neck supple Cardiovascular: Normal rate, regular rhythm and normal heart sounds.  No murmur heard. No BLE edema. Pulmonary/Chest: Effort normal and breath sounds normal. No respiratory distress. Abdominal: Soft.  There is no  tenderness. Psychiatric: Patient has a normal mood and affect. behavior is normal. Judgment and thought content normal.  PHQ2/9: Depression screen [facility] 2/9 07/30/2020 04/17/2020 11/25/2019 08/19/2019 05/20/2019  Decreased Interest 0 0 0 0 0  Down, Depressed, Hopeless 0 0 0 0 0  PHQ - 2 Score 0 0 0 0 0  Altered sleeping 0 - 0 0 0  Tired, decreased energy 1 - 0 0 0  Change in appetite 0 - 0 0 0  Feeling bad or failure about yourself  0 - 0 0 0  Trouble concentrating 0 - 0 0 0  Moving slowly or fidgety/restless 0 - 0 0 0  Suicidal thoughts 0 - 0  0 0  PHQ-9 Score 1 - 0 0 0  Difficult doing work/chores Not difficult at all - - Not difficult at all -    phq 9 is negative   Fall Risk: Fall Risk  07/30/2020 04/17/2020 11/25/2019 10/13/2019 08/19/2019  Falls in the past year? 0 0 0 0 0  Number falls in past yr: 0 0 0 - 0  Injury with Fall? 0 0 0 - 0     Functional Status Survey: Is the patient deaf or have difficulty hearing?: No Does the patient have difficulty seeing, even when wearing glasses/contacts?: No Does the patient have difficulty concentrating, remembering, or making decisions?: No Does the patient have difficulty walking or climbing stairs?: No Does the patient have difficulty dressing or bathing?: No Does the patient have difficulty doing errands alone such as visiting a doctor's office or shopping?: No    Assessment & Plan  1. Thrombocytopenia (HCC)  We will monitor   2. Attention deficit hyperactivity disorder (ADHD), combined type  - amphetamine-dextroamphetamine (ADDERALL XR) 15 MG 24 hr capsule; Take 1 capsule by mouth every morning.  Dispense: 30 capsule; Refill: 0 - amphetamine-dextroamphetamine (ADDERALL XR) 15 MG 24 hr capsule; Take 1 capsule by mouth every morning.  Dispense: 30 capsule; Refill: 0 - amphetamine-dextroamphetamine (ADDERALL XR) 15 MG 24 hr capsule; Take 1 capsule by mouth every morning.  Dispense: 30 capsule; Refill: 0  3. Essential hypertension,  benign   4. Vitamin D deficiency  - Vitamin D, Ergocalciferol, (DRISDOL) 1.25 MG (50000 UNIT) CAPS capsule; Take 1 capsule (50,000 Units total) by mouth every 7 (seven) days.  Dispense: 12 capsule; Refill: 1  5. Pre-diabetes   6. Colon cancer screening  He wants to hold off for now   7. Dyslipidemia   8. Controlled gout   9. Palpitation  He will return for EKG or go to fire department if happens again, or if more frequent contact me for referral to cardiologist. Discussed TSH but he would like to hold off for now

## 2020-07-29 ENCOUNTER — Other Ambulatory Visit: Payer: Self-pay | Admitting: Family Medicine

## 2020-07-29 DIAGNOSIS — I10 Essential (primary) hypertension: Secondary | ICD-10-CM

## 2020-07-30 ENCOUNTER — Ambulatory Visit: Payer: 59 | Admitting: Family Medicine

## 2020-07-30 ENCOUNTER — Other Ambulatory Visit: Payer: Self-pay

## 2020-07-30 ENCOUNTER — Encounter: Payer: Self-pay | Admitting: Family Medicine

## 2020-07-30 VITALS — BP 122/86 | HR 89 | Temp 98.4°F | Resp 18 | Ht 75.0 in | Wt 203.3 lb

## 2020-07-30 DIAGNOSIS — F902 Attention-deficit hyperactivity disorder, combined type: Secondary | ICD-10-CM | POA: Diagnosis not present

## 2020-07-30 DIAGNOSIS — E559 Vitamin D deficiency, unspecified: Secondary | ICD-10-CM

## 2020-07-30 DIAGNOSIS — R7303 Prediabetes: Secondary | ICD-10-CM

## 2020-07-30 DIAGNOSIS — D696 Thrombocytopenia, unspecified: Secondary | ICD-10-CM | POA: Diagnosis not present

## 2020-07-30 DIAGNOSIS — I10 Essential (primary) hypertension: Secondary | ICD-10-CM

## 2020-07-30 DIAGNOSIS — E785 Hyperlipidemia, unspecified: Secondary | ICD-10-CM

## 2020-07-30 DIAGNOSIS — R002 Palpitations: Secondary | ICD-10-CM

## 2020-07-30 DIAGNOSIS — M109 Gout, unspecified: Secondary | ICD-10-CM

## 2020-07-30 DIAGNOSIS — Z1211 Encounter for screening for malignant neoplasm of colon: Secondary | ICD-10-CM

## 2020-07-30 MED ORDER — AMPHETAMINE-DEXTROAMPHET ER 15 MG PO CP24
15.0000 mg | ORAL_CAPSULE | ORAL | 0 refills | Status: DC
Start: 2020-07-30 — End: 2020-11-26

## 2020-07-30 MED ORDER — AMPHETAMINE-DEXTROAMPHET ER 15 MG PO CP24: 15.0000 mg | ORAL_CAPSULE | ORAL | 0 refills | Status: DC

## 2020-07-30 MED ORDER — VITAMIN D (ERGOCALCIFEROL) 1.25 MG (50000 UNIT) PO CAPS: 50000.0000 [IU] | ORAL_CAPSULE | ORAL | 1 refills | Status: DC

## 2020-09-02 ENCOUNTER — Encounter: Payer: Self-pay | Admitting: Family Medicine

## 2020-09-04 NOTE — Telephone Encounter (Signed)
Spoke with pharmacist Marivi at [facility] and was informed that patient still has an rx on file for his Adderall and he can get his refill.   I also spoke with patient and gave him the above information.

## 2020-10-04 ENCOUNTER — Encounter: Payer: Self-pay | Admitting: Family Medicine

## 2020-10-29 ENCOUNTER — Other Ambulatory Visit: Payer: Self-pay | Admitting: Family Medicine

## 2020-10-29 DIAGNOSIS — I10 Essential (primary) hypertension: Secondary | ICD-10-CM

## 2020-10-29 NOTE — Telephone Encounter (Signed)
Requested Prescriptions  Pending Prescriptions Disp Refills  . amLODipine (NORVASC) 5 MG tablet [Pharmacy Med Name: AMLODIPINE BESYLATE 5 MG TAB] 90 tablet 0    Sig: TAKE 1 TABLET BY MOUTH EVERY DAY     Cardiovascular:  Calcium Channel Blockers Passed - 10/29/2020  1:28 AM      Passed - Last BP in normal range    BP Readings from Last 1 Encounters:  07/30/20 122/86         Passed - Valid encounter within last 6 months    Recent Outpatient Visits          3 months ago Thrombocytopenia [facility])   [facility] Cory, MD   6 months ago Thrombocytopenia [facility])   [facility] Alba Cory, MD   11 months ago Attention deficit hyperactivity disorder (ADHD), combined type   [facility] Alba Cory, MD   1 year ago Dyslipidemia   [facility] Alba Cory, MD   1 year ago Essential hypertension, benign   [facility] Alba Cory, MD

## 2020-11-22 ENCOUNTER — Telehealth: Payer: Self-pay | Admitting: Family Medicine

## 2020-11-22 DIAGNOSIS — I10 Essential (primary) hypertension: Secondary | ICD-10-CM

## 2020-11-22 NOTE — Telephone Encounter (Signed)
Pt does not need this amlodipine. He does have an appt on Monday 11/26/20

## 2020-11-26 ENCOUNTER — Other Ambulatory Visit: Payer: Self-pay

## 2020-11-26 ENCOUNTER — Ambulatory Visit: Payer: 59 | Admitting: Family Medicine

## 2020-11-26 ENCOUNTER — Encounter: Payer: Self-pay | Admitting: Family Medicine

## 2020-11-26 VITALS — BP 112/78 | HR 85 | Temp 98.1°F | Resp 16 | Ht 75.0 in | Wt 199.1 lb

## 2020-11-26 DIAGNOSIS — M109 Gout, unspecified: Secondary | ICD-10-CM | POA: Diagnosis not present

## 2020-11-26 DIAGNOSIS — I10 Essential (primary) hypertension: Secondary | ICD-10-CM

## 2020-11-26 DIAGNOSIS — Z1211 Encounter for screening for malignant neoplasm of colon: Secondary | ICD-10-CM

## 2020-11-26 DIAGNOSIS — K219 Gastro-esophageal reflux disease without esophagitis: Secondary | ICD-10-CM

## 2020-11-26 DIAGNOSIS — M2550 Pain in unspecified joint: Secondary | ICD-10-CM

## 2020-11-26 DIAGNOSIS — M549 Dorsalgia, unspecified: Secondary | ICD-10-CM

## 2020-11-26 DIAGNOSIS — E785 Hyperlipidemia, unspecified: Secondary | ICD-10-CM

## 2020-11-26 DIAGNOSIS — F902 Attention-deficit hyperactivity disorder, combined type: Secondary | ICD-10-CM

## 2020-11-26 DIAGNOSIS — E559 Vitamin D deficiency, unspecified: Secondary | ICD-10-CM

## 2020-11-26 DIAGNOSIS — E790 Hyperuricemia without signs of inflammatory arthritis and tophaceous disease: Secondary | ICD-10-CM

## 2020-11-26 DIAGNOSIS — R7303 Prediabetes: Secondary | ICD-10-CM | POA: Diagnosis not present

## 2020-11-26 DIAGNOSIS — Z8261 Family history of arthritis: Secondary | ICD-10-CM

## 2020-11-26 DIAGNOSIS — D696 Thrombocytopenia, unspecified: Secondary | ICD-10-CM

## 2020-11-26 MED ORDER — AMPHETAMINE-DEXTROAMPHET ER 15 MG PO CP24: 15.0000 mg | ORAL_CAPSULE | ORAL | 0 refills | Status: DC

## 2020-11-26 MED ORDER — AMPHETAMINE-DEXTROAMPHET ER 15 MG PO CP24
15.0000 mg | ORAL_CAPSULE | ORAL | 0 refills | Status: DC
Start: 2020-11-26 — End: 2021-04-03

## 2020-11-26 MED ORDER — ALLOPURINOL 100 MG PO TABS: 100.0000 mg | ORAL_TABLET | Freq: Every day | ORAL | 3 refills | Status: DC

## 2020-11-26 NOTE — Progress Notes (Signed)
Name: Victor Jackson   MRN: [id number]    DOB: 1975-12-27   Date:11/26/2020       Progress Note  Subjective  Chief Complaint  Chief Complaint  Patient presents with   Medication Refill    HPI  HTN: he has been taking norasc 5 mg , denies side effects, no chest pain or palpitation. BP is at goal    Gout: Taking Allopurinol daily last uric acid down to 6.2 from 9.2 and no longer having gout attacks. Continue allopurinol    GERD: taking PPI, no heartburn or indigestion since taking medications daily . Unchanged    Dyslipidemia: discussed life style modification again    The 10-year ASCVD risk score Denman George DC Jr., et al., 2013) is: 5.1%   Values used to calculate the score:     Age: 45 years     Sex: Male     Is Non-Hispanic African American: Yes     Diabetic: No     Tobacco smoker: No     Systolic Blood Pressure: 112 mmHg     Is BP treated: Yes     HDL Cholesterol: 51 mg/dL     Total Cholesterol: 145 mg/dL    ADHD: son has ADHD, he was diagnosed at the time that his son was diagnosed with this  Condition about 12 years ago. 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 We will check X-rays today . Worse when it rains, pain is described as sharp pain, worse when laying on dorsal decubitus., mother has RA , he also has hip and knee pain.   Patient Active Problem List   Diagnosis Date Noted   Pre-diabetes 08/19/2019   Dyslipidemia 08/19/2019   Essential hypertension, benign 08/19/2019   Pes planus 09/11/2016   Capsulitis of left foot 09/11/2016   Controlled gout 05/02/2016   Umbilical hernia 11/27/2015   GERD (gastroesophageal reflux disease) 11/27/2015    Thrombocytopenia (HCC) 11/27/2015   Vitamin D deficiency 11/27/2015   History of hypertension 11/27/2015   ADD (attention deficit disorder) 11/27/2015   History of hand fracture     Past Surgical History:  Procedure Laterality Date   KNEE ARTHROSCOPY Left 1998   none      Family History  Problem Relation Age of Onset   Arthritis Mother    Diabetes Mother    Hypertension Mother    COPD Father    Hypertension Father     Social History   Tobacco Use   Smoking status: Former    Packs/day: 0.00    Years: 1.00    Pack years: 0.00    Types: Cigars, Cigarettes    Quit date: 06/28/2014    Years since quitting: 6.4   Smokeless tobacco: Never  Substance Use Topics   Alcohol use: Yes    Alcohol/week: 1.0 standard drink    Types: 1 Cans of beer per week    Comment: rarely     Current Outpatient Medications:    amLODipine (NORVASC) 5 MG tablet, TAKE 1 TABLET BY MOUTH EVERY DAY, Disp: 90 tablet, Rfl: 0   omeprazole (PRILOSEC) 40 MG capsule, TAKE 1 CAPSULE BY MOUTH  EVERY DAY, Disp: 90 capsule, Rfl: 2   Vitamin D, Ergocalciferol, (DRISDOL) 1.25 MG (50000 UNIT) CAPS capsule, Take 1 capsule (50,000 Units total) by mouth every 7 (seven) days., Disp: 12 capsule, Rfl: 1   allopurinol (ZYLOPRIM) 100 MG tablet, Take 1 tablet (100 mg total) by mouth daily., Disp: 90 tablet, Rfl: 3   amphetamine-dextroamphetamine (ADDERALL XR) 15 MG 24 hr capsule, Take 1 capsule by mouth every morning., Disp: 30 capsule, Rfl: 0   amphetamine-dextroamphetamine (ADDERALL XR) 15 MG 24 hr capsule, Take 1 capsule by mouth every morning., Disp: 30 capsule, Rfl: 0   amphetamine-dextroamphetamine (ADDERALL XR) 15 MG 24 hr capsule, Take 1 capsule by mouth every morning., Disp: 30 capsule, Rfl: 0  No Known Allergies  I personally reviewed active problem list, medication list, allergies, family history, social history, health maintenance with the patient/caregiver today.   ROS  Constitutional: Negative for fever  or weight change.  Respiratory: Negative for cough and shortness of breath.   Cardiovascular: Negative for chest pain or palpitations.  Gastrointestinal: Negative for abdominal pain, no bowel changes.  Musculoskeletal: Negative for gait problem or joint swelling.  Skin: Negative for rash.  Neurological: Negative for dizziness or headache.  No other specific complaints in a complete review of systems (except as listed in HPI above).   Objective  Vitals:   11/26/20 1321  BP: 112/78  Pulse: 85  Resp: 16  Temp: 98.1 F (36.7 C)  TempSrc: Oral  SpO2: 98%  Weight: 199 lb 1.6 oz (90.3 kg)  Height: 6\' 3"  (1.905 m)    Body mass index is 24.89 kg/m.  Physical Exam  Constitutional: Patient appears well-developed and well-nourished. No distress.  HEENT: head atraumatic, normocephalic, pupils equal and reactive to light, neck supple Cardiovascular: Normal rate, regular rhythm and normal heart sounds.  No murmur heard. No BLE edema. Pulmonary/Chest: Effort normal and breath sounds normal. No respiratory distress. Abdominal: Soft.  There is no tenderness. Psychiatric: Patient has a normal mood and affect. behavior is normal. Judgment and thought content normal.   PHQ2/9: Depression screen [facility] 2/9 11/26/2020 07/30/2020 04/17/2020 11/25/2019 08/19/2019  Decreased Interest 0 0 0 0 0  Down, Depressed, Hopeless 0 0 0 0 0  PHQ - 2 Score 0 0 0 0 0  Altered sleeping 0 0 - 0 0  Tired, decreased energy 1 1 - 0 0  Change in appetite 0 0 - 0 0  Feeling bad or failure about yourself  0 0 - 0 0  Trouble concentrating 1 0 - 0 0  Moving slowly or fidgety/restless 0 0 - 0 0  Suicidal thoughts 0 0 - 0 0  PHQ-9 Score 2 1 - 0 0  Difficult doing work/chores Not difficult at all Not difficult at all - - Not difficult at all    phq 9 is negative   Fall Risk: Fall Risk  11/26/2020 07/30/2020 04/17/2020 11/25/2019 10/13/2019  Falls in the past year? 0 0 0 0 0  Number falls in past yr: 0 0 0 0 -  Injury with  Fall? 0 0 0 0 -  Follow up Falls prevention discussed - - - -     Functional Status Survey: Is the patient deaf or have difficulty hearing?: No Does the patient have difficulty seeing, even when wearing glasses/contacts?: No Does the patient have difficulty concentrating, remembering, or making decisions?: No Does the patient have difficulty walking or climbing stairs?: No Does the patient have difficulty dressing or bathing?: No Does  the patient have difficulty doing errands alone such as visiting a doctor's office or shopping?: No    Assessment & Plan  1. Essential hypertension, benign  At goal   2. Pre-diabetes   3. Controlled gout  Controlled   4. Colon cancer screening  refuses  5. Attention deficit hyperactivity disorder (ADHD), combined type  - amphetamine-dextroamphetamine (ADDERALL XR) 15 MG 24 hr capsule; Take 1 capsule by mouth every morning.  Dispense: 30 capsule; Refill: 0 - amphetamine-dextroamphetamine (ADDERALL XR) 15 MG 24 hr capsule; Take 1 capsule by mouth every morning.  Dispense: 30 capsule; Refill: 0 - amphetamine-dextroamphetamine (ADDERALL XR) 15 MG 24 hr capsule; Take 1 capsule by mouth every morning.  Dispense: 30 capsule; Refill: 0  6. Vitamin D deficiency  Continue supplementation   7. Dyslipidemia   8. Gastroesophageal reflux disease without esophagitis   9. Elevated uric acid in blood  - allopurinol (ZYLOPRIM) 100 MG tablet; Take 1 tablet (100 mg total) by mouth daily.  Dispense: 90 tablet; Refill: 3

## 2020-11-28 LAB — COMPLETE METABOLIC PANEL WITH GFR
AG Ratio: 1.6 (calc) (ref 1.0–2.5)
ALT: 16 U/L (ref 9–46)
AST: 18 U/L (ref 10–40)
Albumin: 4.4 g/dL (ref 3.6–5.1)
Alkaline phosphatase (APISO): 55 U/L (ref 36–130)
BUN: 14 mg/dL (ref 7–25)
CO2: 28 mmol/L (ref 20–32)
Calcium: 9.4 mg/dL (ref 8.6–10.3)
Chloride: 104 mmol/L (ref 98–110)
Creat: 1.26 mg/dL (ref 0.60–1.35)
GFR, Est African American: 79 mL/min/{1.73_m2} (ref >=60)
GFR, Est Non African American: 68 mL/min/{1.73_m2} (ref >=60)
Globulin: 2.7 g/dL (calc) (ref 1.9–3.7)
Glucose, Bld: 114 mg/dL — ABNORMAL HIGH (ref 65–99)
Potassium: 4 mmol/L (ref 3.5–5.3)
Sodium: 139 mmol/L (ref 135–146)
Total Bilirubin: 0.7 mg/dL (ref 0.2–1.2)
Total Protein: 7.1 g/dL (ref 6.1–8.1)

## 2020-11-28 LAB — ANTI-NUCLEAR AB-TITER (ANA TITER)
ANA TITER: 1:40 {titer} — ABNORMAL HIGH
ANA Titer 1: 1:40 {titer} — ABNORMAL HIGH

## 2020-11-28 LAB — CBC WITH DIFFERENTIAL/PLATELET
Absolute Monocytes: 410 cells/uL (ref 200–950)
Basophils Absolute: 23 cells/uL (ref 0–200)
Basophils Relative: 0.4 %
Eosinophils Absolute: 40 cells/uL (ref 15–500)
Eosinophils Relative: 0.7 %
HCT: 41.9 % (ref 38.5–50.0)
Hemoglobin: 14.1 g/dL (ref 13.2–17.1)
Lymphs Abs: 1864 cells/uL (ref 850–3900)
MCH: 29.6 pg (ref 27.0–33.0)
MCHC: 33.7 g/dL (ref 32.0–36.0)
MCV: 88 fL (ref 80.0–100.0)
MPV: 12.6 fL — ABNORMAL HIGH (ref 7.5–12.5)
Monocytes Relative: 7.2 %
Neutro Abs: 3363 cells/uL (ref 1500–7800)
Neutrophils Relative %: 59 %
Platelets: 129 10*3/uL — ABNORMAL LOW (ref 140–400)
RBC: 4.76 10*6/uL (ref 4.20–5.80)
RDW: 12.4 % (ref 11.0–15.0)
Total Lymphocyte: 32.7 %
WBC: 5.7 10*3/uL (ref 3.8–10.8)

## 2020-11-28 LAB — ANA,IFA RA DIAG PNL W/RFLX TIT/PATN
Anti Nuclear Antibody (ANA): POSITIVE — AB
Cyclic Citrullin Peptide Ab: <16 UNITS
Rheumatoid fact SerPl-aCnc: <14 IU/mL (ref <14)

## 2020-11-28 LAB — SEDIMENTATION RATE: Sed Rate: 2 mm/h (ref 0–15)

## 2020-11-28 LAB — C-REACTIVE PROTEIN: CRP: 1.3 mg/L (ref <8.0)

## 2020-12-03 ENCOUNTER — Other Ambulatory Visit: Payer: Self-pay | Admitting: Family Medicine

## 2020-12-03 ENCOUNTER — Encounter: Payer: Self-pay | Admitting: Family Medicine

## 2020-12-03 DIAGNOSIS — R768 Other specified abnormal immunological findings in serum: Secondary | ICD-10-CM

## 2020-12-03 DIAGNOSIS — Z8261 Family history of arthritis: Secondary | ICD-10-CM

## 2020-12-03 DIAGNOSIS — M2550 Pain in unspecified joint: Secondary | ICD-10-CM

## 2021-02-03 ENCOUNTER — Other Ambulatory Visit: Payer: Self-pay | Admitting: Family Medicine

## 2021-02-03 DIAGNOSIS — I10 Essential (primary) hypertension: Secondary | ICD-10-CM

## 2021-02-03 DIAGNOSIS — K219 Gastro-esophageal reflux disease without esophagitis: Secondary | ICD-10-CM

## 2021-02-03 NOTE — Telephone Encounter (Signed)
Requested Prescriptions  Pending Prescriptions Disp Refills  . amLODipine (NORVASC) 5 MG tablet [Pharmacy Med Name: AMLODIPINE BESYLATE 5 MG TAB] 90 tablet 0    Sig: TAKE 1 TABLET BY MOUTH EVERY DAY     Cardiovascular:  Calcium Channel Blockers Passed - 02/03/2021  4:23 PM      Passed - Last BP in normal range    BP Readings from Last 1 Encounters:  11/26/20 112/78         Passed - Valid encounter within last 6 months    Recent Outpatient Visits          2 months ago Essential hypertension, benign   [facility] Alba Cory, MD   6 months ago Thrombocytopenia [facility])   [facility] Alba Cory, MD   9 months ago Thrombocytopenia [facility])   [facility] Alba Cory, MD   1 year ago Attention deficit hyperactivity disorder (ADHD), combined type   [facility] Alba Cory, MD   1 year ago Dyslipidemia   [facility] [place], Danna Hefty, MD             . omeprazole (PRILOSEC) 40 MG capsule [Pharmacy Med Name: OMEPRAZOLE DR 40 MG CAPSULE] 90 capsule 0    Sig: TAKE 1 CAPSULE BY MOUTH EVERY DAY     Gastroenterology: Proton Pump Inhibitors Passed - 02/03/2021  4:23 PM      Passed - Valid encounter within last 12 months    Recent Outpatient Visits          2 months ago Essential hypertension, benign   [facility] Alba Cory, MD   6 months ago Thrombocytopenia [facility])   [facility] Alba Cory, MD   9 months ago Thrombocytopenia [facility])   [facility] Alba Cory, MD   1 year ago Attention deficit hyperactivity disorder (ADHD), combined type   [facility] Alba Cory, MD   1 year ago Dyslipidemia   [facility] Alba Cory, MD

## 2021-02-28 ENCOUNTER — Ambulatory Visit: Payer: 59 | Admitting: Family Medicine

## 2021-03-08 ENCOUNTER — Ambulatory Visit: Payer: 59 | Admitting: Family Medicine

## 2021-03-15 ENCOUNTER — Encounter: Payer: Self-pay | Admitting: General Surgery

## 2021-04-03 ENCOUNTER — Encounter: Payer: Self-pay | Admitting: Family Medicine

## 2021-04-03 ENCOUNTER — Ambulatory Visit: Payer: 59 | Admitting: Family Medicine

## 2021-04-03 ENCOUNTER — Other Ambulatory Visit: Payer: Self-pay

## 2021-04-03 VITALS — BP 110/70 | HR 85 | Temp 98.0°F | Resp 16 | Ht 75.0 in | Wt 202.4 lb

## 2021-04-03 DIAGNOSIS — M109 Gout, unspecified: Secondary | ICD-10-CM

## 2021-04-03 DIAGNOSIS — R768 Other specified abnormal immunological findings in serum: Secondary | ICD-10-CM | POA: Diagnosis not present

## 2021-04-03 DIAGNOSIS — K219 Gastro-esophageal reflux disease without esophagitis: Secondary | ICD-10-CM

## 2021-04-03 DIAGNOSIS — E785 Hyperlipidemia, unspecified: Secondary | ICD-10-CM

## 2021-04-03 DIAGNOSIS — Z1211 Encounter for screening for malignant neoplasm of colon: Secondary | ICD-10-CM

## 2021-04-03 DIAGNOSIS — E559 Vitamin D deficiency, unspecified: Secondary | ICD-10-CM

## 2021-04-03 DIAGNOSIS — D696 Thrombocytopenia, unspecified: Secondary | ICD-10-CM

## 2021-04-03 DIAGNOSIS — R7303 Prediabetes: Secondary | ICD-10-CM

## 2021-04-03 DIAGNOSIS — F902 Attention-deficit hyperactivity disorder, combined type: Secondary | ICD-10-CM

## 2021-04-03 DIAGNOSIS — I10 Essential (primary) hypertension: Secondary | ICD-10-CM

## 2021-04-03 MED ORDER — AMPHETAMINE-DEXTROAMPHET ER 15 MG PO CP24: 15.0000 mg | ORAL_CAPSULE | ORAL | 0 refills | Status: DC

## 2021-04-03 MED ORDER — VITAMIN D (ERGOCALCIFEROL) 1.25 MG (50000 UNIT) PO CAPS: 50000.0000 [IU] | ORAL_CAPSULE | ORAL | 1 refills | Status: DC

## 2021-04-03 MED ORDER — OMEPRAZOLE 40 MG PO CPDR: 40.0000 mg | DELAYED_RELEASE_CAPSULE | Freq: Every day | ORAL | 1 refills | Status: DC

## 2021-04-03 MED ORDER — AMLODIPINE BESYLATE 5 MG PO TABS: 5.0000 mg | ORAL_TABLET | Freq: Every day | ORAL | 1 refills | Status: DC

## 2021-04-03 NOTE — Progress Notes (Signed)
Name: Victor Jackson   MRN: [id number]    DOB: [birth year]   Date:04/03/2021       Progress Note  Subjective  Chief Complaint  Medication Refill  HPI  HTN: he has been taking norasc 5 mg , denies side effects, no chest pain, palpitation or orthostatic changes . BP towards low end of normal but did not take Adderal today    Gout: Taking Allopurinol daily last uric acid down to 6.2 from 9.2 and no longer having gout attacks. Continue allopurinol , no side effects of mediations    GERD: taking PPI, no heartburn or indigestion since taking medications daily . Unchanged    Dyslipidemia: discussed life style modification again   The 10-year ASCVD risk score (Arnett DK, et al., 2019) is: 4.9%   Values used to calculate the score:     Age: 45 years     Sex: Male     Is Non-Hispanic African American: Yes     Diabetic: No     Tobacco smoker: No     Systolic Blood Pressure: 110 mmHg     Is BP treated: Yes     HDL Cholesterol: 51 mg/dL     Total Cholesterol: 145 mg/dL    ADHD: son has ADHD, he was diagnosed at the time that his son was diagnosed with this condition about 13 years ago. He states medication helps him focus and accomplish tasks at work, without medication it takes him longer to get his work done. He denies side effects of medication     Thrombocytopenia: last level stable , denies easy bruising , reviewed last labs and stable.   Chronic Back Pain  going on for the past two years, initially went chiropractor and had X-rays done, he had it adjusted but did not improve. He was referred to Rheumatologist due to elevated ANA and other joint pains and was advised to have PT for knee pain, use topical medication and was given reassurance. He is seeing Dr. Landry Mellow for chronic thoracic and low back pain.  Had X-ray and had lumbar spondylosis The plan is to get MRI if no improvement by his follow up. He states his back pain has improved over the past couple of weeks, triggered by increase in  activity but doing well today   Patient Active Problem List   Diagnosis Date Noted   Pre-diabetes 08/19/2019   Dyslipidemia 08/19/2019   Essential hypertension, benign 08/19/2019   Pes planus 09/11/2016   Capsulitis of left foot 09/11/2016   Controlled gout 05/02/2016   Umbilical hernia 11/27/2015   GERD (gastroesophageal reflux disease) 11/27/2015   Thrombocytopenia (HCC) 11/27/2015   Vitamin D deficiency 11/27/2015   History of hypertension 11/27/2015   ADD (attention deficit disorder) 11/27/2015   History of hand fracture     Past Surgical History:  Procedure Laterality Date   KNEE ARTHROSCOPY Left 1998   none      Family History  Problem Relation Age of Onset   Arthritis Mother    Diabetes Mother    Hypertension Mother    COPD Father    Hypertension Father     Social History   Tobacco Use   Smoking status: Former    Packs/day: 0.00    Years: 1.00    Pack years: 0.00    Types: Cigars, Cigarettes    Quit date: 06/28/2014    Years since quitting: 6.7   Smokeless tobacco: Never  Substance Use Topics   Alcohol use: Yes  Alcohol/week: 1.0 standard drink    Types: 1 Cans of beer per week    Comment: rarely     Current Outpatient Medications:    allopurinol (ZYLOPRIM) 100 MG tablet, Take 1 tablet (100 mg total) by mouth daily., Disp: 90 tablet, Rfl: 3   amLODipine (NORVASC) 5 MG tablet, TAKE 1 TABLET BY MOUTH EVERY DAY, Disp: 90 tablet, Rfl: 0   amphetamine-dextroamphetamine (ADDERALL XR) 15 MG 24 hr capsule, Take 1 capsule by mouth every morning., Disp: 30 capsule, Rfl: 0   amphetamine-dextroamphetamine (ADDERALL XR) 15 MG 24 hr capsule, Take 1 capsule by mouth every morning., Disp: 30 capsule, Rfl: 0   amphetamine-dextroamphetamine (ADDERALL XR) 15 MG 24 hr capsule, Take 1 capsule by mouth every morning., Disp: 30 capsule, Rfl: 0   omeprazole (PRILOSEC) 40 MG capsule, TAKE 1 CAPSULE BY MOUTH EVERY DAY, Disp: 90 capsule, Rfl: 0   Vitamin D, Ergocalciferol,  (DRISDOL) 1.25 MG (50000 UNIT) CAPS capsule, Take 1 capsule (50,000 Units total) by mouth every 7 (seven) days., Disp: 12 capsule, Rfl: 1  No Known Allergies  I personally reviewed active problem list, medication list, allergies, family history, social history, health maintenance with the patient/caregiver today.   ROS  Constitutional: Negative for fever or weight change.  Respiratory: Negative for cough and shortness of breath.   Cardiovascular: Negative for chest pain or palpitations.  Gastrointestinal: Negative for abdominal pain, no bowel changes.  Musculoskeletal: Negative for gait problem or joint swelling.  Skin: Negative for rash.  Neurological: Negative for dizziness or headache.  No other specific complaints in a complete review of systems (except as listed in HPI above).   Objective  Vitals:   04/03/21 1253  BP: 110/70  Pulse: 85  Resp: 16  Temp: 98 F (36.7 C)  TempSrc: Oral  SpO2: 98%  Weight: 202 lb 6.4 oz (91.8 kg)  Height: 6\' 3"  (1.905 m)    Body mass index is 25.3 kg/m.  Physical Exam  Constitutional: Patient appears well-developed and well-nourished.  No distress.  HEENT: head atraumatic, normocephalic, pupils equal and reactive to light, neck supple Cardiovascular: Normal rate, regular rhythm and normal heart sounds.  No murmur heard. No BLE edema. Pulmonary/Chest: Effort normal and breath sounds normal. No respiratory distress. Abdominal: Soft.  There is no tenderness. Psychiatric: Patient has a normal mood and affect. behavior is normal. Judgment and thought content normal.     PHQ2/9: Depression screen [facility] 2/9 04/03/2021 11/26/2020 07/30/2020 04/17/2020 11/25/2019  Decreased Interest 0 0 0 0 0  Down, Depressed, Hopeless 0 0 0 0 0  PHQ - 2 Score 0 0 0 0 0  Altered sleeping 0 0 0 - 0  Tired, decreased energy 0 1 1 - 0  Change in appetite 0 0 0 - 0  Feeling bad or failure about yourself  0 0 0 - 0  Trouble concentrating 0 1 0 - 0  Moving slowly  or fidgety/restless 0 0 0 - 0  Suicidal thoughts 0 0 0 - 0  PHQ-9 Score 0 2 1 - 0  Difficult doing work/chores Not difficult at all Not difficult at all Not difficult at all - -    phq 9 is negative   Fall Risk: Fall Risk  04/03/2021 11/26/2020 07/30/2020 04/17/2020 11/25/2019  Falls in the past year? 0 0 0 0 0  Number falls in past yr: 0 0 0 0 0  Injury with Fall? 0 0 0 0 0  Risk for fall due to :  No Fall Risks - - - -  Follow up Falls prevention discussed Falls prevention discussed - - -      Functional Status Survey: Is the patient deaf or have difficulty hearing?: No Does the patient have difficulty seeing, even when wearing glasses/contacts?: No Does the patient have difficulty concentrating, remembering, or making decisions?: Yes Does the patient have difficulty walking or climbing stairs?: No Does the patient have difficulty dressing or bathing?: No Does the patient have difficulty doing errands alone such as visiting a doctor's office or shopping?: No    Assessment & Plan  1. Pre-diabetes  Discussed low carb diet  2. Positive ANA (antinuclear antibody)   3. Vitamin D deficiency  - Vitamin D, Ergocalciferol, (DRISDOL) 1.25 MG (50000 UNIT) CAPS capsule; Take 1 capsule (50,000 Units total) by mouth every 7 (seven) days.  Dispense: 12 capsule; Refill: 1  4. Controlled gout   5. Attention deficit hyperactivity disorder (ADHD), combined type  - amphetamine-dextroamphetamine (ADDERALL XR) 15 MG 24 hr capsule; Take 1 capsule by mouth every morning.  Dispense: 30 capsule; Refill: 0 - amphetamine-dextroamphetamine (ADDERALL XR) 15 MG 24 hr capsule; Take 1 capsule by mouth every morning.  Dispense: 30 capsule; Refill: 0 - amphetamine-dextroamphetamine (ADDERALL XR) 15 MG 24 hr capsule; Take 1 capsule by mouth every morning.  Dispense: 30 capsule; Refill: 0  6. Gastroesophageal reflux disease without esophagitis  - omeprazole (PRILOSEC) 40 MG capsule; Take 1 capsule (40  mg total) by mouth daily.  Dispense: 90 capsule; Refill: 1  7. Dyslipidemia   8. Essential hypertension, benign  - amLODipine (NORVASC) 5 MG tablet; Take 1 tablet (5 mg total) by mouth daily.  Dispense: 90 tablet; Refill: 1  9. Thrombocytopenia (HCC)  Stable, recheck yearly

## 2021-05-30 ENCOUNTER — Ambulatory Visit: Payer: Self-pay | Admitting: Gastroenterology

## 2021-06-06 ENCOUNTER — Ambulatory Visit: Payer: Self-pay | Admitting: Gastroenterology

## 2021-07-10 NOTE — Progress Notes (Signed)
Name: Victor Jackson   MRN: [id number]    DOB: 1976-03-15   Date:07/11/2021       Progress Note  Subjective  Chief Complaint  Annual Exam  HPI  Patient presents for annual CPE and follow up  HTN: he has been taking norasc 5 mg , denies side effects, no chest pain, palpitation, SOB  , he occasionally gets light headed when playing basketball. Advised to increase electrolytes the day he will play sports, and also to take only half pill in am and the other half afterwards    Gout: Taking Allopurinol daily last uric acid down to 6.2 from 9.2 and no longer having gout attacks. Continue allopurinol , we will recheck labs today    GERD: taking PPI, no heartburn or indigestion since taking medications daily , he states symptoms returns when he skips a dose .    Dyslipidemia: discussed life style modification again    The 10-year ASCVD risk score (Arnett DK, et al., 2019) is: 6.3%   Values used to calculate the score:     Age: 46 years     Sex: Male     Is Non-Hispanic African American: Yes     Diabetic: No     Tobacco smoker: No     Systolic Blood Pressure: 123XX123 mmHg     Is BP treated: Yes     HDL Cholesterol: 51 mg/dL     Total Cholesterol: 145 mg/dL    ADHD: son has ADHD, he was diagnosed at the time that his oldest son was diagnosed with ADD around age 65. He states medication helps him focus and accomplish tasks at work, without medication it takes him longer to get his work done. He denies side effects of medication  and would like to continue taking it. Reminded him it is a controlled medication   Thrombocytopenia: we will recheck labs, he denies bruising and no blood in stools , hematuria  or bleeding gums when he brushes his teeth    Chronic Back Pain  going on for the past two years, initially went chiropractor and had X-rays done, he had it adjusted but did not improve. He was referred to Rheumatologist due to elevated ANA and other joint pains and was advised to have PT for knee  pain, use topical medication and was given reassurance. He is seeing Dr. Candelaria Stagers for chronic thoracic and low back pain.  Had X-ray and had lumbar spondylosis The plan is to get MRI if no improvement by his follow up. He is doing well lately. He is considering going back to chiropractor  Left ear pain: denies hearing loss, usually present when he wakes up. No fever or chills.   IPSS Questionnaire (AUA-7): Over the past month   1)  How often have you had a sensation of not emptying your bladder completely after you finish urinating?  0 - Not at all  2)  How often have you had to urinate again less than two hours after you finished urinating? 0 - Not at all  3)  How often have you found you stopped and started again several times when you urinated?  0 - Not at all  4) How difficult have you found it to postpone urination?  0 - Not at all  5) How often have you had a weak urinary stream?  0 - Not at all  6) How often have you had to push or strain to begin urination?  0 - Not at all  7) How many times did you most typically get up to urinate from the time you went to bed until the time you got up in the morning?  0 - None  Total score:  0-7 mildly symptomatic   8-19 moderately symptomatic   20-35 severely symptomatic     Diet: discussed balanced diet  Exercise: continue regular physical activity   Depression: phq 9 is negative Depression screen [facility] 2/9 07/11/2021 04/03/2021 11/26/2020 07/30/2020 04/17/2020  Decreased Interest 0 0 0 0 0  Down, Depressed, Hopeless 0 0 0 0 0  PHQ - 2 Score 0 0 0 0 0  Altered sleeping 0 0 0 0 -  Tired, decreased energy 0 0 1 1 -  Change in appetite 0 0 0 0 -  Feeling bad or failure about yourself  0 0 0 0 -  Trouble concentrating 0 0 1 0 -  Moving slowly or fidgety/restless 0 0 0 0 -  Suicidal thoughts 0 0 0 0 -  PHQ-9 Score 0 0 2 1 -  Difficult doing work/chores - Not difficult at all Not difficult at all Not difficult at all -  Some recent data might be  hidden    Hypertension:  BP Readings from Last 3 Encounters:  07/11/21 126/74  04/03/21 110/70  11/26/20 112/78    Obesity: Wt Readings from Last 3 Encounters:  07/11/21 200 lb (90.7 kg)  04/03/21 202 lb 6.4 oz (91.8 kg)  11/26/20 199 lb 1.6 oz (90.3 kg)   BMI Readings from Last 3 Encounters:  07/11/21 25.00 kg/m  04/03/21 25.30 kg/m  11/26/20 24.89 kg/m     Lipids:  Lab Results  Component Value Date   CHOL 145 04/17/2020   CHOL 189 04/01/2019   CHOL 149 09/11/2016   Lab Results  Component Value Date   HDL 51 04/17/2020   HDL 51 04/01/2019   HDL 55 09/11/2016   Lab Results  Component Value Date   LDLCALC 76 04/17/2020   LDLCALC 112 (H) 04/01/2019   LDLCALC 79 09/11/2016   Lab Results  Component Value Date   TRIG 94 04/17/2020   TRIG 145 04/01/2019   TRIG 74 09/11/2016   Lab Results  Component Value Date   CHOLHDL 2.8 04/17/2020   CHOLHDL 3.7 04/01/2019   CHOLHDL 2.7 09/11/2016   No results found for: LDLDIRECT Glucose:  Glucose, Bld  Date Value Ref Range Status  11/26/2020 114 (H) 65 - 99 mg/dL Final    Comment:    .            Fasting reference interval . For someone without known diabetes, a glucose value between 100 and 125 mg/dL is consistent with prediabetes and should be confirmed with a follow-up test. .   04/17/2020 85 65 - 99 mg/dL Final    Comment:    .            Fasting reference interval .   04/01/2019 78 65 - 99 mg/dL Final    Comment:    .            Fasting reference interval .     [place] Office Visit from 07/30/2020 in [facility]  AUDIT-C Score 0       Married STD testing and prevention (HIV/chl/gon/syphilis): N/A Hep C: 04/17/20  Skin cancer: Discussed monitoring for atypical lesions Colorectal cancer: N/A Prostate cancer: discussed USPTF   Lung cancer: Low Dose CT Chest recommended if Age 46-80 years, 37  pack-year currently smoking OR have quit w/in 15years. Patient does  not qualify.   ECG:  04/01/19  Vaccines:   Pneumonia:discussed with patient  Flu: refused COVID-19: he only had one shot, he will send me a copy   Advanced Care Planning: A voluntary discussion about advance care planning including the explanation and discussion of advance directives.  Discussed health care proxy and Living will, and the patient was able to identify a health care proxy as wife.  Patient does not have a living will at present time. If patient does have living will, I have requested they bring this to the clinic to be scanned in to their chart.  Patient Active Problem List   Diagnosis Date Noted   Pre-diabetes 08/19/2019   Dyslipidemia 08/19/2019   Essential hypertension, benign 08/19/2019   Pes planus 09/11/2016   Capsulitis of left foot 09/11/2016   Controlled gout [id number]   Umbilical hernia [id number]   GERD (gastroesophageal reflux disease) 11/27/2015   Thrombocytopenia ([place]) 11/27/2015   Vitamin D deficiency 11/27/2015   History of hypertension 11/27/2015   ADD (attention deficit disorder) 11/27/2015   History of hand fracture     Past Surgical History:  Procedure Laterality Date   KNEE ARTHROSCOPY Left 1998   none      Family History  Problem Relation Age of Onset   Arthritis Mother    Diabetes Mother    Hypertension Mother    COPD Father    Hypertension Father     Social History   Socioeconomic History   Marital status: Married    Spouse name: [place]   Number of children: Not on file   Years of education: Not on file   Highest education level: Not on file  Occupational History   Not on file  Tobacco Use   Smoking status: Former    Packs/day: 0.00    Years: 1.00    Pack years: 0.00    Types: Cigars, Cigarettes    Quit date: 06/28/2014    Years since quitting: 7.0   Smokeless tobacco: Never  Vaping Use   Vaping Use: Never used  Substance and Sexual Activity   Alcohol use: Yes    Alcohol/week: 1.0 standard drink    Types: 1 Cans  of beer per week    Comment: rarely   Drug use: No   Sexual activity: Yes    Partners: Female    Birth control/protection: Other-see comments    Comment: girlfriend had tubal ligation   Other Topics Concern   Not on file  Social History Narrative   Lives with girlfriend Caryl Comes ) and their 3 children.    Working at [employer], Arts administrator.    Social Determinants of Health   Financial Resource Strain: Low Risk    Difficulty of Paying Living Expenses: Not hard at all  Food Insecurity: No Food Insecurity   Worried About Charity fundraiser in the Last Year: Never true   [place] in the Last Year: Never true  Transportation Needs: No Transportation Needs   Lack of Transportation (Medical): No   Lack of Transportation (Non-Medical): No  Physical Activity: Sufficiently Active   Days of Exercise per Week: 5 days   Minutes of Exercise per Session: 120 min  Stress: No Stress Concern Present   Feeling of Stress : Not at all  Social Connections: Moderately Isolated   Frequency of Communication with Friends and Family: Once a week   Frequency of  Social Gatherings with Friends and Family: Once a week   Attends Religious Services: 1 to 4 times per year   Active Member of [employer] or Organizations: No   Attends Music therapist: Never   Marital Status: Married  Human resources officer Violence: Not At Risk   Fear of Current or Ex-Partner: No   Emotionally Abused: No   Physically Abused: No   Sexually Abused: No     Current Outpatient Medications:    allopurinol (ZYLOPRIM) 100 MG tablet, Take 1 tablet (100 mg total) by mouth daily., Disp: 90 tablet, Rfl: 3   amLODipine (NORVASC) 5 MG tablet, Take 1 tablet (5 mg total) by mouth daily., Disp: 90 tablet, Rfl: 1   amphetamine-dextroamphetamine (ADDERALL XR) 15 MG 24 hr capsule, Take 1 capsule by mouth every morning., Disp: 30 capsule, Rfl: 0   amphetamine-dextroamphetamine (ADDERALL XR) 15 MG 24 hr capsule, Take 1  capsule by mouth every morning., Disp: 30 capsule, Rfl: 0   amphetamine-dextroamphetamine (ADDERALL XR) 15 MG 24 hr capsule, Take 1 capsule by mouth every morning., Disp: 30 capsule, Rfl: 0   omeprazole (PRILOSEC) 40 MG capsule, Take 1 capsule (40 mg total) by mouth daily., Disp: 90 capsule, Rfl: 1   Vitamin D, Ergocalciferol, (DRISDOL) 1.25 MG (50000 UNIT) CAPS capsule, Take 1 capsule (50,000 Units total) by mouth every 7 (seven) days., Disp: 12 capsule, Rfl: 1  No Known Allergies   ROS  Constitutional: Negative for fever or weight change.  Respiratory: Negative for cough and shortness of breath.   Cardiovascular: Negative for chest pain or palpitations.  Gastrointestinal: Negative for abdominal pain, no bowel changes.  Musculoskeletal: Negative for gait problem or joint swelling.  Skin: Negative for rash.  Neurological: Negative for dizziness or headache.  No other specific complaints in a complete review of systems (except as listed in HPI above).    Objective  Vitals:   07/11/21 0918  BP: 126/74  Pulse: 72  Resp: 16  Temp: 98.5 F (36.9 C)  SpO2: 98%  Weight: 200 lb (90.7 kg)  Height: 6\' 3"  (1.905 m)    Body mass index is 25 kg/m.  Physical Exam  Constitutional: Patient appears well-developed and well-nourished. No distress.  HENT: Head: Normocephalic and atraumatic. Ears: B TMs ok, no erythema or effusion; Nose: not done . Mouth/Throat:not done  Eyes: Conjunctivae and EOM are normal. Pupils are equal, round, and reactive to light. No scleral icterus.  Neck: Normal range of motion. Neck supple. No JVD present. No thyromegaly present.  Cardiovascular: Normal rate, regular rhythm and normal heart sounds.  No murmur heard. No BLE edema. Pulmonary/Chest: Effort normal and breath sounds normal. No respiratory distress. Abdominal: Soft. Bowel sounds are normal, no distension. There is no tenderness. no masses MALE GENITALIA: Normal descended testes bilaterally, no masses  palpated, no hernias, no lesions, no discharge RECTAL: not done  Musculoskeletal: Normal range of motion, no joint effusions. No gross deformities Neurological: he is alert and oriented to person, place, and time. No cranial nerve deficit. Coordination, balance, strength, speech and gait are normal.  Skin: Skin is warm and dry. No rash noted. No erythema.  Psychiatric: Patient has a normal mood and affect. behavior is normal. Judgment and thought content normal.   Fall Risk: Fall Risk  07/11/2021 04/03/2021 11/26/2020 07/30/2020 04/17/2020  Falls in the past year? 0 0 0 0 0  Number falls in past yr: 0 0 0 0 0  Injury with Fall? 0 0 0 0 0  Risk for fall due to : No Fall Risks No Fall Risks - - -  Follow up Falls prevention discussed Falls prevention discussed Falls prevention discussed - -      Functional Status Survey: Is the patient deaf or have difficulty hearing?: No Does the patient have difficulty seeing, even when wearing glasses/contacts?: No Does the patient have difficulty concentrating, remembering, or making decisions?: No Does the patient have difficulty walking or climbing stairs?: No Does the patient have difficulty dressing or bathing?: No Does the patient have difficulty doing errands alone such as visiting a doctor's office or shopping?: No    Assessment & Plan  1. Well adult exam  - Lipid panel - Hemoglobin A1c - CBC with Differential/Platelet - COMPLETE METABOLIC PANEL WITH GFR - Uric acid  2. Pre-diabetes  - Hemoglobin A1c  3. Vitamin D deficiency   4. Controlled gout   5. Attention deficit hyperactivity disorder (ADHD), combined type  - Uric acid - amphetamine-dextroamphetamine (ADDERALL XR) 15 MG 24 hr capsule; Take 1 capsule by mouth every morning.  Dispense: 30 capsule; Refill: 0 - amphetamine-dextroamphetamine (ADDERALL XR) 15 MG 24 hr capsule; Take 1 capsule by mouth every morning.  Dispense: 30 capsule; Refill: 0 -  amphetamine-dextroamphetamine (ADDERALL XR) 15 MG 24 hr capsule; Take 1 capsule by mouth every morning.  Dispense: 30 capsule; Refill: 0  6. Gastroesophageal reflux disease without esophagitis   7. Dyslipidemia  - Lipid panel  8. Essential hypertension, benign  - COMPLETE METABOLIC PANEL WITH GFR  9. Thrombocytopenia (HCC)  - CBC with Differential/Platelet  11. Impacted cerumen of left ear  Verbal consent given Possible side effects discussed with patient Ears were  lavaged with warm water and peroxide  Patient tolerated procedure well No complications       -Prostate cancer screening and PSA options (with potential risks and benefits of testing vs not testing) were discussed along with recent recs/guidelines. -USPSTF grade A and B recommendations reviewed with patient; age-appropriate recommendations, preventive care, screening tests, etc discussed and encouraged; healthy living encouraged; see AVS for patient education given to patient -Discussed importance of 150 minutes of physical activity weekly, eat two servings of fish weekly, eat one serving of tree nuts ( cashews, pistachios, pecans, almonds.Marland Kitchen) every other day, eat 6 servings of fruit/vegetables daily and drink plenty of water and avoid sweet beverages.

## 2021-07-11 ENCOUNTER — Ambulatory Visit (INDEPENDENT_AMBULATORY_CARE_PROVIDER_SITE_OTHER): Payer: 59 | Admitting: Family Medicine

## 2021-07-11 ENCOUNTER — Encounter: Payer: Self-pay | Admitting: Family Medicine

## 2021-07-11 VITALS — BP 126/74 | HR 72 | Temp 98.5°F | Resp 16 | Ht 75.0 in | Wt 200.0 lb

## 2021-07-11 DIAGNOSIS — F902 Attention-deficit hyperactivity disorder, combined type: Secondary | ICD-10-CM

## 2021-07-11 DIAGNOSIS — E559 Vitamin D deficiency, unspecified: Secondary | ICD-10-CM

## 2021-07-11 DIAGNOSIS — K219 Gastro-esophageal reflux disease without esophagitis: Secondary | ICD-10-CM

## 2021-07-11 DIAGNOSIS — E790 Hyperuricemia without signs of inflammatory arthritis and tophaceous disease: Secondary | ICD-10-CM

## 2021-07-11 DIAGNOSIS — I10 Essential (primary) hypertension: Secondary | ICD-10-CM

## 2021-07-11 DIAGNOSIS — Z0000 Encounter for general adult medical examination without abnormal findings: Secondary | ICD-10-CM | POA: Diagnosis not present

## 2021-07-11 DIAGNOSIS — R7303 Prediabetes: Secondary | ICD-10-CM | POA: Diagnosis not present

## 2021-07-11 DIAGNOSIS — E785 Hyperlipidemia, unspecified: Secondary | ICD-10-CM

## 2021-07-11 DIAGNOSIS — H6122 Impacted cerumen, left ear: Secondary | ICD-10-CM

## 2021-07-11 DIAGNOSIS — D696 Thrombocytopenia, unspecified: Secondary | ICD-10-CM

## 2021-07-11 DIAGNOSIS — M109 Gout, unspecified: Secondary | ICD-10-CM

## 2021-07-11 MED ORDER — AMPHETAMINE-DEXTROAMPHET ER 15 MG PO CP24: 15.0000 mg | ORAL_CAPSULE | ORAL | 0 refills | Status: DC

## 2021-07-11 NOTE — Patient Instructions (Signed)

## 2021-07-12 LAB — CBC WITH DIFFERENTIAL/PLATELET
Absolute Monocytes: 441 cells/uL (ref 200–950)
Basophils Absolute: 32 cells/uL (ref 0–200)
Basophils Relative: 0.7 %
Eosinophils Absolute: 41 cells/uL (ref 15–500)
Eosinophils Relative: 0.9 %
HCT: 43.5 % (ref 38.5–50.0)
Hemoglobin: 14.6 g/dL (ref 13.2–17.1)
Lymphs Abs: 1422 cells/uL (ref 850–3900)
MCH: 29.7 pg (ref 27.0–33.0)
MCHC: 33.6 g/dL (ref 32.0–36.0)
MCV: 88.4 fL (ref 80.0–100.0)
MPV: 13 fL — ABNORMAL HIGH (ref 7.5–12.5)
Monocytes Relative: 9.8 %
Neutro Abs: 2565 cells/uL (ref 1500–7800)
Neutrophils Relative %: 57 %
Platelets: 133 10*3/uL — ABNORMAL LOW (ref 140–400)
RBC: 4.92 10*6/uL (ref 4.20–5.80)
RDW: 12.5 % (ref 11.0–15.0)
Total Lymphocyte: 31.6 %
WBC: 4.5 10*3/uL (ref 3.8–10.8)

## 2021-07-12 LAB — COMPLETE METABOLIC PANEL WITH GFR
AG Ratio: 1.5 (calc) (ref 1.0–2.5)
ALT: 19 U/L (ref 9–46)
AST: 18 U/L (ref 10–40)
Albumin: 4.3 g/dL (ref 3.6–5.1)
Alkaline phosphatase (APISO): 53 U/L (ref 36–130)
BUN: 14 mg/dL (ref 7–25)
CO2: 29 mmol/L (ref 20–32)
Calcium: 9.7 mg/dL (ref 8.6–10.3)
Chloride: 105 mmol/L (ref 98–110)
Creat: 1.19 mg/dL (ref 0.60–1.29)
Globulin: 2.8 g/dL (calc) (ref 1.9–3.7)
Glucose, Bld: 87 mg/dL (ref 65–99)
Potassium: 4.3 mmol/L (ref 3.5–5.3)
Sodium: 139 mmol/L (ref 135–146)
Total Bilirubin: 0.8 mg/dL (ref 0.2–1.2)
Total Protein: 7.1 g/dL (ref 6.1–8.1)
eGFR: 76 mL/min/{1.73_m2} (ref >=60)

## 2021-07-12 LAB — URIC ACID: Uric Acid, Serum: 5.4 mg/dL (ref 4.0–8.0)

## 2021-07-12 LAB — HEMOGLOBIN A1C
Hgb A1c MFr Bld: 5.5 % of total Hgb (ref <5.7)
Mean Plasma Glucose: 111 mg/dL
eAG (mmol/L): 6.2 mmol/L

## 2021-07-12 LAB — LIPID PANEL
Cholesterol: 150 mg/dL (ref <200)
HDL: 62 mg/dL (ref >=40)
LDL Cholesterol (Calc): 74 mg/dL (calc)
Non-HDL Cholesterol (Calc): 88 mg/dL (calc) (ref <130)
Total CHOL/HDL Ratio: 2.4 (calc) (ref <5.0)
Triglycerides: 55 mg/dL (ref <150)

## 2021-07-25 ENCOUNTER — Ambulatory Visit: Payer: Self-pay | Admitting: Gastroenterology

## 2021-08-08 NOTE — Progress Notes (Signed)
Name: Victor Jackson   MRN: [id number]    DOB: Oct 05, 1975   Date:08/12/2021       Progress Note  Subjective  Chief Complaint  Cough/Congestion  HPI  He developed URI about one month ago, initially had cough, fatigue, nasal congestion and rhinorrhea, home covid test negative. He is now having morening headaches, feels very stuffy on right side of nostril, but cough is not as productive or frequent, no wheezing or SOB   Patient Active Problem List   Diagnosis Date Noted   Pre-diabetes 08/19/2019   Dyslipidemia 08/19/2019   Essential hypertension, benign 08/19/2019   Pes planus 09/11/2016   Capsulitis of left foot 09/11/2016   Controlled gout 51/70/0174   Umbilical hernia 94/49/6759   GERD (gastroesophageal reflux disease) 11/27/2015   Thrombocytopenia ([place]) 11/27/2015   Vitamin D deficiency 11/27/2015   History of hypertension 11/27/2015   ADD (attention deficit disorder) 11/27/2015   History of hand fracture     Past Surgical History:  Procedure Laterality Date   KNEE ARTHROSCOPY Left 1998   none      Family History  Problem Relation Age of Onset   Arthritis Mother    Diabetes Mother    Hypertension Mother    COPD Father    Hypertension Father     Social History   Tobacco Use   Smoking status: Former    Packs/day: 0.00    Years: 1.00    Pack years: 0.00    Types: Cigars, Cigarettes    Quit date: 06/28/2014    Years since quitting: 7.1   Smokeless tobacco: Never  Substance Use Topics   Alcohol use: Yes    Alcohol/week: 1.0 standard drink    Types: 1 Cans of beer per week    Comment: rarely     Current Outpatient Medications:    allopurinol (ZYLOPRIM) 100 MG tablet, Take 1 tablet (100 mg total) by mouth daily., Disp: 90 tablet, Rfl: 3   amLODipine (NORVASC) 5 MG tablet, Take 1 tablet (5 mg total) by mouth daily., Disp: 90 tablet, Rfl: 1   amphetamine-dextroamphetamine (ADDERALL XR) 15 MG 24 hr capsule, Take 1 capsule by mouth every morning., Disp: 30 capsule,  Rfl: 0   amphetamine-dextroamphetamine (ADDERALL XR) 15 MG 24 hr capsule, Take 1 capsule by mouth every morning., Disp: 30 capsule, Rfl: 0   amphetamine-dextroamphetamine (ADDERALL XR) 15 MG 24 hr capsule, Take 1 capsule by mouth every morning., Disp: 30 capsule, Rfl: 0   omeprazole (PRILOSEC) 40 MG capsule, Take 1 capsule (40 mg total) by mouth daily., Disp: 90 capsule, Rfl: 1   Vitamin D, Ergocalciferol, (DRISDOL) 1.25 MG (50000 UNIT) CAPS capsule, Take 1 capsule (50,000 Units total) by mouth every 7 (seven) days., Disp: 12 capsule, Rfl: 1  No Known Allergies  I personally reviewed active problem list, medication list, allergies, family history, social history, health maintenance with the patient/caregiver today.   ROS  Ten systems reviewed and is negative except as mentioned in HPI   Objective  Vitals:   08/12/21 1536  BP: 132/84  Pulse: 92  Resp: 16  SpO2: 97%  Weight: 199 lb (90.3 kg)  Height: 6' 3" (1.905 m)    Body mass index is 24.87 kg/m.  Physical Exam  Constitutional: Patient appears well-developed and well-nourished.  No distress.  HEENT: head atraumatic, normocephalic, pupils equal and reactive to light, ears bogy turbinates, but more swollen on right nostril,  neck supple, throat showed some coblestone  Cardiovascular: Normal rate, regular rhythm  and normal heart sounds.  No murmur heard. No BLE edema. Pulmonary/Chest: Effort normal and breath sounds normal. No respiratory distress. Abdominal: Soft.  There is no tenderness. Psychiatric: Patient has a normal mood and affect. behavior is normal. Judgment and thought content normal.   Recent Results (from the past 2160 hour(s))  Lipid panel     Status: None   Collection Time: 07/11/21 10:13 AM  Result Value Ref Range   Cholesterol 150 <200 mg/dL   HDL 62 > OR = 40 mg/dL   Triglycerides 55 <150 mg/dL   LDL Cholesterol (Calc) 74 mg/dL (calc)    Comment: Reference range: <100 . Desirable range <100 mg/dL for  primary prevention;   <70 mg/dL for patients with CHD or diabetic patients  with > or = 2 CHD risk factors. Marland Kitchen LDL-C is now calculated using the Martin-Hopkins  calculation, which is a validated novel method providing  better accuracy than the Friedewald equation in the  estimation of LDL-C.  Cresenciano Genre et al. Annamaria Helling. 4401;027(25): 2061-2068  (http://education.[facility].com/faq/FAQ164)    Total CHOL/HDL Ratio 2.4 <5.0 (calc)   Non-HDL Cholesterol (Calc) 88 <130 mg/dL (calc)    Comment: For patients with diabetes plus 1 major ASCVD risk  factor, treating to a non-HDL-C goal of <100 mg/dL  (LDL-C of <70 mg/dL) is considered a therapeutic  option.   Hemoglobin A1c     Status: None   Collection Time: 07/11/21 10:13 AM  Result Value Ref Range   Hgb A1c MFr Bld 5.5 <5.7 % of total Hgb    Comment: For the purpose of screening for the presence of diabetes: . <5.7%       Consistent with the absence of diabetes 5.7-6.4%    Consistent with increased risk for diabetes             (prediabetes) > or =6.5%  Consistent with diabetes . This assay result is consistent with a decreased risk of diabetes. . Currently, no consensus exists regarding use of hemoglobin A1c for diagnosis of diabetes in children. . According to American Diabetes Association (ADA) guidelines, hemoglobin A1c <7.0% represents optimal control in non-pregnant diabetic patients. Different metrics may apply to specific patient populations.  Standards of Medical Care in Diabetes(ADA). .    Mean Plasma Glucose 111 mg/dL   eAG (mmol/L) 6.2 mmol/L  CBC with Differential/Platelet     Status: Abnormal   Collection Time: 07/11/21 10:13 AM  Result Value Ref Range   WBC 4.5 3.8 - 10.8 Thousand/uL   RBC 4.92 4.20 - 5.80 Million/uL   Hemoglobin 14.6 13.2 - 17.1 g/dL   HCT 43.5 38.5 - 50.0 %   MCV 88.4 80.0 - 100.0 fL   MCH 29.7 27.0 - 33.0 pg   MCHC 33.6 32.0 - 36.0 g/dL   RDW 12.5 11.0 - 15.0 %   Platelets 133 (L)  140 - 400 Thousand/uL   MPV 13.0 (H) 7.5 - 12.5 fL   Neutro Abs 2,565 1,500 - 7,800 cells/uL   Lymphs Abs 1,422 850 - 3,900 cells/uL   Absolute Monocytes 441 200 - 950 cells/uL   Eosinophils Absolute 41 15 - 500 cells/uL   Basophils Absolute 32 0 - 200 cells/uL   Neutrophils Relative % 57 %   Total Lymphocyte 31.6 %   Monocytes Relative 9.8 %   Eosinophils Relative 0.9 %   Basophils Relative 0.7 %  COMPLETE METABOLIC PANEL WITH GFR     Status: None   Collection Time: 07/11/21 10:13 AM  Result Value Ref Range   Glucose, Bld 87 65 - 99 mg/dL    Comment: .            Fasting reference interval .    BUN 14 7 - 25 mg/dL   Creat 1.19 0.60 - 1.29 mg/dL   eGFR 76 > OR = 60 mL/min/1.68m    Comment: The eGFR is based on the CKD-EPI 2021 equation. To calculate  the new eGFR from a previous Creatinine or Cystatin C result, go to https://www.kidney.org/professionals/ kdoqi/gfr%5Fcalculator    BUN/Creatinine Ratio NOT APPLICABLE 6 - 22 (calc)   Sodium 139 135 - 146 mmol/L   Potassium 4.3 3.5 - 5.3 mmol/L   Chloride 105 98 - 110 mmol/L   CO2 29 20 - 32 mmol/L   Calcium 9.7 8.6 - 10.3 mg/dL   Total Protein 7.1 6.1 - 8.1 g/dL   Albumin 4.3 3.6 - 5.1 g/dL   Globulin 2.8 1.9 - 3.7 g/dL (calc)   AG Ratio 1.5 1.0 - 2.5 (calc)   Total Bilirubin 0.8 0.2 - 1.2 mg/dL   Alkaline phosphatase (APISO) 53 36 - 130 U/L   AST 18 10 - 40 U/L   ALT 19 9 - 46 U/L  Uric acid     Status: None   Collection Time: 07/11/21 10:13 AM  Result Value Ref Range   Uric Acid, Serum 5.4 4.0 - 8.0 mg/dL    Comment: Therapeutic target for gout patients: <6.0 mg/dL .    PH[facility]2/9[facility] 08/12/2021 07/11/2021 04/03/2021 11/26/2020 07/30/2020  Decreased Interest 0 0 0 0 0  Down, Depressed, Hopeless 0 0 0 0 0  PHQ - 2 Score 0 0 0 0 0  Altered sleeping 0 0 0 0 0  Tired, decreased energy 0 0 0 1 1  Change in appetite 0 0 0 0 0  Feeling bad or failure about yourself  0 0 0 0 0  Trouble concentrating 0  0 0 1 0  Moving slowly or fidgety/restless 0 0 0 0 0  Suicidal thoughts 0 0 0 0 0  PHQ-9 Score 0 0 0 2 1  Difficult doing work/chores - - Not difficult at all Not difficult at all Not difficult at all  Some recent data might be hidden    phq 9 is negative   Fall Risk: Fall Risk  08/12/2021 07/11/2021 04/03/2021 11/26/2020 07/30/2020  Falls in the past year? 0 0 0 0 0  Number falls in past yr: 0 0 0 0 0  Injury with Fall? 0 0 0 0 0  Risk for fall due to : No Fall Risks No Fall Risks No Fall Risks - -  Follow up Falls prevention discussed Falls prevention discussed Falls prevention discussed Falls prevention discussed -      Functional Status Survey: Is the patient deaf or have difficulty hearing?: No Does the patient have difficulty seeing, even when wearing glasses/contacts?: No Does the patient have difficulty concentrating, remembering, or making decisions?: No Does the patient have difficulty walking or climbing stairs?: No Does the patient have difficulty dressing or bathing?: No Does the patient have difficulty doing errands alone such as visiting a doctor's office or shopping?: No    Assessment & Plan  1. Post-viral cough syndrome  - benzonatate (TESSALON) 200 MG capsule; Take 1 capsule (200 mg total) by mouth at bedtime.  Dispense: 30 capsule; Refill: 0 - umeclidinium-vilanterol (ANORO ELLIPTA) 62.5-25 MCG/ACT AEPB; Inhale 1 puff into the lungs daily  at 6 (six) AM.  Dispense: 1 each; Refill: 0  2. Nasal congestion  - azelastine (ASTELIN) 0.1 % nasal spray; Place 1 spray into both nostrils 2 (two) times daily. Use in each nostril as directed  Dispense: 30 mL; Refill: 0 - fluticasone (FLONASE) 50 MCG/ACT nasal spray; Place 1 spray into both nostrils every evening.  Dispense: 16 g; Refill: 0

## 2021-08-12 ENCOUNTER — Ambulatory Visit: Payer: 59 | Admitting: Family Medicine

## 2021-08-12 ENCOUNTER — Encounter: Payer: Self-pay | Admitting: Family Medicine

## 2021-08-12 VITALS — BP 132/84 | HR 92 | Resp 16 | Ht 75.0 in | Wt 199.0 lb

## 2021-08-12 DIAGNOSIS — R0981 Nasal congestion: Secondary | ICD-10-CM

## 2021-08-12 DIAGNOSIS — R058 Other specified cough: Secondary | ICD-10-CM | POA: Diagnosis not present

## 2021-08-12 MED ORDER — AZELASTINE HCL 0.1 % NA SOLN: 1.0000 | Freq: Two times a day (BID) | NASAL | 0 refills | Status: DC

## 2021-08-12 MED ORDER — FLUTICASONE PROPIONATE 50 MCG/ACT NA SUSP: 1.0000 | Freq: Every evening | NASAL | 0 refills | Status: DC

## 2021-08-12 MED ORDER — BENZONATATE 200 MG PO CAPS: 200.0000 mg | ORAL_CAPSULE | Freq: Every day | ORAL | 0 refills | Status: DC

## 2021-08-12 MED ORDER — UMECLIDINIUM-VILANTEROL 62.5-25 MCG/ACT IN AEPB: 1.0000 | INHALATION_SPRAY | Freq: Every day | RESPIRATORY_TRACT | 0 refills | Status: DC

## 2021-09-03 ENCOUNTER — Other Ambulatory Visit: Payer: Self-pay | Admitting: Family Medicine

## 2021-09-03 DIAGNOSIS — R0981 Nasal congestion: Secondary | ICD-10-CM

## 2021-09-10 ENCOUNTER — Encounter: Payer: Self-pay | Admitting: Family Medicine

## 2021-09-19 ENCOUNTER — Ambulatory Visit (INDEPENDENT_AMBULATORY_CARE_PROVIDER_SITE_OTHER): Payer: 59 | Admitting: Internal Medicine

## 2021-09-19 ENCOUNTER — Encounter: Payer: Self-pay | Admitting: Internal Medicine

## 2021-09-19 VITALS — BP 124/72 | HR 67 | Temp 97.8°F | Resp 16 | Ht 75.0 in | Wt 201.5 lb

## 2021-09-19 DIAGNOSIS — H1011 Acute atopic conjunctivitis, right eye: Secondary | ICD-10-CM

## 2021-09-19 MED ORDER — CROMOLYN SODIUM 4 % OP SOLN: 1.0000 [drp] | Freq: Four times a day (QID) | OPHTHALMIC | 12 refills | Status: DC

## 2021-09-19 NOTE — Patient Instructions (Addendum)
It was great seeing you today! ? ?Plan discussed at today's visit: ?-Use new eyedrop 1 drop in affected eye 4-6 times a day sent to pharmacy ?-Can get and over the counter eye drop for allergies called Zaditor  ?-Can also use over the counter Refresh drops to help with dry eye ? ?Follow up in: as needed  ? ?Take care and let us know if you have any questions or concerns prior to your next visit. ? ?Dr. Caralee Ates ? ?Allergic Conjunctivitis, Adult ?Allergic conjunctivitis is inflammation of the conjunctiva. The conjunctiva is the thin, clear membrane that covers the white part of the eye and the inner surface of the eyelid. In this condition: ?The blood vessels in the conjunctiva become irritated and swell. ?The eyes become red or pink and feel itchy. ?Allergic conjunctivitis cannot be spread from person to person. This condition can develop at any age and may be outgrown. ?What are the causes? ?This condition is caused by allergens. These are things that can cause an allergic reaction in some people but not in other people. Common allergens include: ?Outdoor allergens, such as: ?Pollen, including pollen from grass and weeds. ?Mold spores. ?Car fumes. ?Indoor allergens, such as: ?Dust. ?Smoke. ?Mold spores. ?Proteins in a pet's urine, saliva, or dander. ?What increases the risk? ?You may be more likely to develop this condition if you have a family history of these things: ?Allergies. ?Conditions caused by being exposed to allergens, such as: ?Allergic rhinitis. This is an allergic reaction that affects the nose. ?Bronchial asthma. This condition affects the large airways in the lungs and makes breathing difficult. ?Atopic dermatitis (eczema). This is inflammation of the skin that is long-term (chronic). ?What are the signs or symptoms? ?Symptoms of this condition include eyes that are: ?Itchy. ?Red. ?Watery. ?Puffy. ?Your eyes may also: ?Sting or burn. ?Have clear fluid draining from them. ?Have thick mucus discharge  and pain (vernal conjunctivitis). ?How is this diagnosed? ?This condition may be diagnosed by: ?Your medical history. ?A physical exam. ?Tests of the fluid draining from your eyes to rule out other causes. ?Other tests to confirm the diagnosis, including: ?Testing for allergies. The skin may be pricked with a tiny needle. The pricked area is then exposed to small amounts of allergens. ?Testing for other eye conditions. Tests may include: ?Blood tests. ?Tissue scrapings from your eyelid. The tissue is then checked under a microscope. ?How is this treated? ?This condition may be treated with: ?Cold, wet cloths (cold compresses) to soothe itching and swelling. ?Washing the face to remove allergens. ?Eye drops. These may be prescription or over-the-counter. You may need to try different types to see which one works best for you, such as: ?Eye drops that block the allergic reaction (antihistamine). ?Eye drops that reduce swelling and irritation (anti-inflammatory). ?Steroid eye drops, which may be given if other treatments have not worked (vernal conjunctivitis). ?Oral antihistamine medicines. These are medicines taken by mouth to lessen your allergic reaction. You may need these if eye drops do not help or are difficult to use. ?Follow these instructions at home: ?Eye care ?Apply a clean, cold compress to your eyes for 10-20 minutes, 3-4 times a day. ?Do not touch or rub your eyes. ?Do not wear contact lenses until the inflammation is gone. Wear glasses instead. ?Do not wear eye makeup until the inflammation is gone. ?General instructions ?Avoid known allergens whenever possible. ?Take or apply over-the-counter and prescription medicines only as told by your health care provider. These include any eye  drops. ?Drink enough fluid to keep your urine pale yellow. ?Keep all follow-up visits as told by your health care provider. This is important. ?Contact a health care provider if: ?Your symptoms get worse or do not get  better with treatment. ?You have mild eye pain. ?You become sensitive to light. ?You have spots or blisters on your eyes. ?You have pus draining from your eyes. ?You have a fever. ?Get help right away if: ?You have redness, swelling, or other symptoms in only one eye. ?Your vision is blurred or you have other vision changes. ?You have severe eye pain. ?Summary ?Allergic conjunctivitis is inflammation of the clear membrane that covers the white part of the eye and the inner surface of the eyelid. ?Take or apply over-the-counter and prescription medicines only as told by your health care provider. These include eye drops. ?Do not touch or rub your eyes. ?Contact a health care provider if your symptoms get worse or do not get better with treatment. ?This information is not intended to replace advice given to you by your health care provider. Make sure you discuss any questions you have with your health care provider. ?Document Revised: 04/25/2019 Document Reviewed: 04/25/2019 ?Elsevier Patient Education ? 2022 Elsevier Inc. ?  ?

## 2021-09-19 NOTE — Progress Notes (Signed)
? ?Acute Office Visit ? ?Subjective:  ? ? Patient ID: Victor Jackson, male    DOB: 21-Mar-1976, 46 y.o.   MRN: [id number] ? ?Chief Complaint  ?Patient presents with  ? Conjunctivitis  ?  Right eye with reddness, itching and crusting  ? ? ?HPI ?Patient is in today for red eye. Woke up this morning with yellow crust around right eye and redness, no pain or vision changes. ? ?RED EYE ?Duration:   1 day ?Involved eye:  right ?Onset: sudden ?No pain  ?No vision changes ?Woke up with yellow crust in the right this morning  ?Foreign body sensation:no but gritty feeling ?Visual impairment: no ?Eye redness: yes ?Discharge: yes ?Crusting or matting of eyelids: yes ?Swelling: no ?Photophobia: no ?Itching: yes ?Tearing: yes ?Headache: no ?Floaters: no ?URI symptoms:  congestion, post-nasal drip ?Contact lens use: no ?Close contacts with similar problems:  yes ?Eye trauma: no ?Status: stable ? ?Past Medical History:  ?Diagnosis Date  ? ADD (attention deficit disorder)   ? GERD (gastroesophageal reflux disease)   ? Hypertension   ? Vitamin D deficiency   ? ? ?Past Surgical History:  ?Procedure Laterality Date  ? KNEE ARTHROSCOPY Left 1998  ? none    ? ? ?Family History  ?Problem Relation Age of Onset  ? Arthritis Mother   ? Diabetes Mother   ? Hypertension Mother   ? COPD Father   ? Hypertension Father   ? ? ?Social History  ? ?Socioeconomic History  ? Marital status: Married  ?  Spouse name: [place]  ? Number of children: Not on file  ? Years of education: Not on file  ? Highest education level: Not on file  ?Occupational History  ? Not on file  ?Tobacco Use  ? Smoking status: Former  ?  Packs/day: 0.00  ?  Years: 1.00  ?  Pack years: 0.00  ?  Types: Cigars, Cigarettes  ?  Quit date: 06/28/2014  ?  Years since quitting: 7.2  ? Smokeless tobacco: Never  ?Vaping Use  ? Vaping Use: Never used  ?Substance and Sexual Activity  ? Alcohol use: Yes  ?  Alcohol/week: 1.0 standard drink  ?  Types: 1 Cans of beer per week  ?  Comment: rarely  ?  Drug use: No  ? Sexual activity: Yes  ?  Partners: Female  ?  Birth control/protection: Other-see comments  ?  Comment: girlfriend had tubal ligation   ?Other Topics Concern  ? Not on file  ?Social History Narrative  ? Lives with girlfriend Caryl Comes ) and their 3 children.   ? Working at [employer], Arts administrator.   ? ?Social Determinants of Health  ? ?Financial Resource Strain: Low Risk   ? Difficulty of Paying Living Expenses: Not hard at all  ?Food Insecurity: No Food Insecurity  ? Worried About Charity fundraiser in the Last Year: Never true  ? Ran Out of Food in the Last Year: Never true  ?Transportation Needs: No Transportation Needs  ? Lack of Transportation (Medical): No  ? Lack of Transportation (Non-Medical): No  ?Physical Activity: Sufficiently Active  ? Days of Exercise per Week: 5 days  ? Minutes of Exercise per Session: 120 min  ?Stress: No Stress Concern Present  ? Feeling of Stress : Not at all  ?Social Connections: Moderately Isolated  ? Frequency of Communication with Friends and Family: Once a week  ? Frequency of Social Gatherings with Friends and Family: Once a  week  ? Attends Religious Services: 1 to 4 times per year  ? Active Member of Clubs or Organizations: No  ? Attends Archivist Meetings: Never  ? Marital Status: Married  ?Intimate Partner Violence: Not At Risk  ? Fear of Current or Ex-Partner: No  ? Emotionally Abused: No  ? Physically Abused: No  ? Sexually Abused: No  ? ? ?Outpatient Medications Prior to Visit  ?Medication Sig Dispense Refill  ? allopurinol (ZYLOPRIM) 100 MG tablet Take 1 tablet (100 mg total) by mouth daily. 90 tablet 3  ? amLODipine (NORVASC) 5 MG tablet Take 1 tablet (5 mg total) by mouth daily. 90 tablet 1  ? amphetamine-dextroamphetamine (ADDERALL XR) 15 MG 24 hr capsule Take 1 capsule by mouth every morning. 30 capsule 0  ? amphetamine-dextroamphetamine (ADDERALL XR) 15 MG 24 hr capsule Take 1 capsule by mouth every morning. 30 capsule 0  ?  amphetamine-dextroamphetamine (ADDERALL XR) 15 MG 24 hr capsule Take 1 capsule by mouth every morning. 30 capsule 0  ? Azelastine HCl 137 MCG/SPRAY SOLN PLACE 1 SPRAY INTO BOTH NOSTRILS 2 (TWO) TIMES DAILY. USE IN EACH NOSTRIL AS DIRECTED 30 mL 1  ? benzonatate (TESSALON) 200 MG capsule Take 1 capsule (200 mg total) by mouth at bedtime. 30 capsule 0  ? fluticasone (FLONASE) 50 MCG/ACT nasal spray PLACE 1 SPRAY INTO BOTH NOSTRILS EVERY EVENING. 16 mL 2  ? omeprazole (PRILOSEC) 40 MG capsule Take 1 capsule (40 mg total) by mouth daily. 90 capsule 1  ? umeclidinium-vilanterol (ANORO ELLIPTA) 62.5-25 MCG/ACT AEPB Inhale 1 puff into the lungs daily at 6 (six) AM. 1 each 0  ? Vitamin D, Ergocalciferol, (DRISDOL) 1.25 MG (50000 UNIT) CAPS capsule Take 1 capsule (50,000 Units total) by mouth every 7 (seven) days. 12 capsule 1  ? ?No facility-administered medications prior to visit.  ? ? ?No Known Allergies ? ?Review of Systems  ?Constitutional:  Negative for chills and fever.  ?HENT:  Positive for congestion and postnasal drip.   ?Eyes:  Positive for discharge, redness and itching. Negative for photophobia, pain and visual disturbance.  ?Respiratory:  Negative for cough and shortness of breath.   ?Neurological:  Negative for headaches.  ? ?   ?Objective:  ?  ?Physical Exam ?Constitutional:   ?   Appearance: Normal appearance.  ?HENT:  ?   Head: Normocephalic and atraumatic.  ?   Comments: No pain over maxillary/frontal sinuses bilaterally  ?   Nose: Congestion present.  ?   Mouth/Throat:  ?   Mouth: Mucous membranes are moist.  ?   Comments: PND present ?Eyes:  ?   General:     ?   Right eye: No discharge.     ?   Left eye: No discharge.  ?   Extraocular Movements: Extraocular movements intact.  ?   Pupils: Pupils are equal, round, and reactive to light.  ?   Comments: Right eye erythema   ?Cardiovascular:  ?   Rate and Rhythm: Normal rate and regular rhythm.  ?Pulmonary:  ?   Effort: Pulmonary effort is normal.  ?   Breath  sounds: Normal breath sounds.  ?Musculoskeletal:  ?   Right lower leg: No edema.  ?   Left lower leg: No edema.  ?Skin: ?   General: Skin is warm and dry.  ?Neurological:  ?   General: No focal deficit present.  ?   Mental Status: He is alert. Mental status is at baseline.  ?Psychiatric:     ?  Mood and Affect: Mood normal.     ?   Behavior: Behavior normal.  ? ? ?BP 124/72   Pulse 67   Temp 97.8 ?F (36.6 ?C)   Resp 16   Ht _0  (1.905 m)   Wt 201 lb 8 oz (91.4 kg)   SpO2 99%   BMI 25.19 kg/m?  ?Wt Readings from Last 3 Encounters:  ?08/12/21 199 lb (90.3 kg)  ?07/11/21 200 lb (90.7 kg)  ?04/03/21 202 lb 6.4 oz (91.8 kg)  ? ? ?Health Maintenance Due  ?Topic Date Due  ? COVID-19 Vaccine (1) Never done  ? COLONOSCOPY (Pts 45-66yr Insurance coverage will need to be confirmed)  Never done  ? ? ?There are no preventive care reminders to display for this patient. ? ? ?Lab Results  ?Component Value Date  ? TSH 0.78 04/01/2019  ? ?Lab Results  ?Component Value Date  ? WBC 4.5 07/11/2021  ? HGB 14.6 07/11/2021  ? HCT 43.5 07/11/2021  ? MCV 88.4 07/11/2021  ? PLT 133 (L) 07/11/2021  ? ?Lab Results  ?Component Value Date  ? NA 139 07/11/2021  ? K 4.3 07/11/2021  ? CO2 29 07/11/2021  ? GLUCOSE 87 07/11/2021  ? BUN 14 07/11/2021  ? CREATININE 1.19 07/11/2021  ? BILITOT 0.8 07/11/2021  ? ALKPHOS 49 09/11/2016  ? AST 18 07/11/2021  ? ALT 19 07/11/2021  ? PROT 7.1 07/11/2021  ? ALBUMIN 4.1 09/11/2016  ? CALCIUM 9.7 07/11/2021  ? EGFR 76 07/11/2021  ? ?Lab Results  ?Component Value Date  ? CHOL 150 07/11/2021  ? ?Lab Results  ?Component Value Date  ? HDL 62 07/11/2021  ? ?Lab Results  ?Component Value Date  ? [place]74 07/11/2021  ? ?Lab Results  ?Component Value Date  ? TRIG 55 07/11/2021  ? ?Lab Results  ?Component Value Date  ? CHOLHDL 2.4 07/11/2021  ? ?Lab Results  ?Component Value Date  ? HGBA1C 5.5 07/11/2021  ? ? ?   ?Assessment & Plan:  ? ?1. Allergic conjunctivitis of right eye: Symptoms consistent with allergic  conjunctivitis, continue nasal sprays for congestion/post-nasal drip and will add allergy eye drops. Also recommend over the counter Refresh drops for dry eye.  ? ?- cromolyn (OPTICROM) 4 % ophthalmic solut

## 2021-09-22 ENCOUNTER — Other Ambulatory Visit: Payer: Self-pay | Admitting: Family Medicine

## 2021-09-22 DIAGNOSIS — R0981 Nasal congestion: Secondary | ICD-10-CM

## 2021-09-23 ENCOUNTER — Other Ambulatory Visit: Payer: Self-pay

## 2021-10-18 ENCOUNTER — Other Ambulatory Visit: Payer: Self-pay | Admitting: Family Medicine

## 2021-10-18 DIAGNOSIS — E559 Vitamin D deficiency, unspecified: Secondary | ICD-10-CM

## 2021-10-30 NOTE — Progress Notes (Signed)
Name: Victor Jackson   MRN: [id number]    DOB: 04/05/1976   Date:10/30/2021       Progress Note  Subjective  Chief Complaint  Follow up   I connected with  Victor Jackson on 10/30/21 at  9:00 AM EDT by telephone and verified that I am speaking with the correct person using two identifiers.  I discussed the limitations, risks, security and privacy concerns of performing an evaluation and management service by telephone and the availability of in person appointments. Staff also discussed with the patient that there may be a patient responsible charge related to this service. Patient agreed on having a virtual visit  Patient Location: at work  Provider Location: [facility] Additional Individuals present: alone   HPI  HTN: he has been taking norasc 5 mg , denies side effects, no chest pain, palpitation or orthostatic changes . Last BP when he came in to the office was at goal     GERD: taking PPI, no heartburn or indigestion since taking medications daily to be able to control symptoms .    ADHD: son has ADHD, he was diagnosed at the time that his son was diagnosed with this condition about 13 years ago. He states medication helps him focus at work, he missed a visit and has been out of Adderal for the past couple of months, he has noticed difficulty staying on task, he would like to resume taking it . No side effects   Gout: no recent episodes   Patient Active Problem List   Diagnosis Date Noted   Pre-diabetes 08/19/2019   Dyslipidemia 08/19/2019   Essential hypertension, benign 08/19/2019   Pes planus 09/11/2016   Capsulitis of left foot 09/11/2016   Controlled gout [id number]   Umbilical hernia [id number]   GERD (gastroesophageal reflux disease) 11/27/2015   Thrombocytopenia ([place]) 11/27/2015   Vitamin D deficiency 11/27/2015   History of hypertension 11/27/2015   ADD (attention deficit disorder) 11/27/2015   History of hand fracture     Past Surgical History:  Procedure Laterality  Date   KNEE ARTHROSCOPY Left 1998   none      Family History  Problem Relation Age of Onset   Arthritis Mother    Diabetes Mother    Hypertension Mother    COPD Father    Hypertension Father     Social History   Socioeconomic History   Marital status: Married    Spouse name: [place]   Number of children: Not on file   Years of education: Not on file   Highest education level: Not on file  Occupational History   Not on file  Tobacco Use   Smoking status: Former    Packs/day: 0.00    Years: 1.00    Pack years: 0.00    Types: Cigars, Cigarettes    Quit date: 06/28/2014    Years since quitting: 7.3   Smokeless tobacco: Never  Vaping Use   Vaping Use: Never used  Substance and Sexual Activity   Alcohol use: Yes    Alcohol/week: 1.0 standard drink    Types: 1 Cans of beer per week    Comment: rarely   Drug use: No   Sexual activity: Yes    Partners: Female    Birth control/protection: Other-see comments    Comment: girlfriend had tubal ligation   Other Topics Concern   Not on file  Social History Narrative   Lives with girlfriend Caryl Comes ) and their 3 children.  Working at [employer], Arts administrator.    Social Determinants of Health   Financial Resource Strain: Low Risk    Difficulty of Paying Living Expenses: Not hard at all  Food Insecurity: No Food Insecurity   Worried About Charity fundraiser in the Last Year: Never true   [place] in the Last Year: Never true  Transportation Needs: No Transportation Needs   Lack of Transportation (Medical): No   Lack of Transportation (Non-Medical): No  Physical Activity: Sufficiently Active   Days of Exercise per Week: 5 days   Minutes of Exercise per Session: 120 min  Stress: No Stress Concern Present   Feeling of Stress : Not at all  Social Connections: Moderately Isolated   Frequency of Communication with Friends and Family: Once a week   Frequency of Social Gatherings with Friends and Family:  Once a week   Attends Religious Services: 1 to 4 times per year   Active Member of [employer] or Organizations: No   Attends Music therapist: Never   Marital Status: Married  Human resources officer Violence: Not At Risk   Fear of Current or Ex-Partner: No   Emotionally Abused: No   Physically Abused: No   Sexually Abused: No     Current Outpatient Medications:    allopurinol (ZYLOPRIM) 100 MG tablet, Take 1 tablet (100 mg total) by mouth daily., Disp: 90 tablet, Rfl: 3   amLODipine (NORVASC) 5 MG tablet, Take 1 tablet (5 mg total) by mouth daily., Disp: 90 tablet, Rfl: 1   amphetamine-dextroamphetamine (ADDERALL XR) 15 MG 24 hr capsule, Take 1 capsule by mouth every morning., Disp: 30 capsule, Rfl: 0   amphetamine-dextroamphetamine (ADDERALL XR) 15 MG 24 hr capsule, Take 1 capsule by mouth every morning., Disp: 30 capsule, Rfl: 0   amphetamine-dextroamphetamine (ADDERALL XR) 15 MG 24 hr capsule, Take 1 capsule by mouth every morning., Disp: 30 capsule, Rfl: 0   Azelastine HCl 137 MCG/SPRAY SOLN, PLACE 1 SPRAY INTO BOTH NOSTRILS 2 (TWO) TIMES DAILY. USE IN EACH NOSTRIL AS DIRECTED, Disp: 30 mL, Rfl: 1   benzonatate (TESSALON) 200 MG capsule, Take 1 capsule (200 mg total) by mouth at bedtime., Disp: 30 capsule, Rfl: 0   cromolyn (OPTICROM) 4 % ophthalmic solution, Place 1 drop into both eyes 4 (four) times daily., Disp: 10 mL, Rfl: 12   fluticasone (FLONASE) 50 MCG/ACT nasal spray, PLACE 1 SPRAY INTO BOTH NOSTRILS EVERY EVENING., Disp: 16 mL, Rfl: 2   omeprazole (PRILOSEC) 40 MG capsule, Take 1 capsule (40 mg total) by mouth daily., Disp: 90 capsule, Rfl: 1   umeclidinium-vilanterol (ANORO ELLIPTA) 62.5-25 MCG/ACT AEPB, Inhale 1 puff into the lungs daily at 6 (six) AM., Disp: 1 each, Rfl: 0   Vitamin D, Ergocalciferol, (DRISDOL) 1.25 MG (50000 UNIT) CAPS capsule, TAKE 1 CAPSULE (50,000 UNITS TOTAL) BY MOUTH EVERY 7 (SEVEN) DAYS, Disp: 12 capsule, Rfl: 1  No Known Allergies  I personally  reviewed active problem list, medication list, allergies, family history with the patient/caregiver today.   ROS  Ten systems reviewed and is negative except as mentioned in HPI   Objective  Virtual encounter, vitals not obtained.  There is no height or weight on file to calculate BMI.  Physical Exam  Awake, alert and oriented   PHQ2/9:    09/19/2021    2:13 PM 08/12/2021    3:31 PM 07/11/2021    9:18 AM 04/03/2021   12:53 PM 11/26/2020    1:24  PM  Depression screen PHQ 2/9  Decreased Interest 0 0 0 0 0  Down, Depressed, Hopeless 0 0 0 0 0  PHQ - 2 Score 0 0 0 0 0  Altered sleeping 0 0 0 0 0  Tired, decreased energy 0 0 0 0 1  Change in appetite 0 0 0 0 0  Feeling bad or failure about yourself  0 0 0 0 0  Trouble concentrating 0 0 0 0 1  Moving slowly or fidgety/restless 0 0 0 0 0  Suicidal thoughts 0 0 0 0 0  PHQ-9 Score 0 0 0 0 2  Difficult doing work/chores Not difficult at all   Not difficult at all Not difficult at all   PHQ-2/9 Result is negative.    Fall Risk:    09/19/2021    2:11 PM 08/12/2021    3:31 PM 07/11/2021    9:17 AM 04/03/2021   12:53 PM 11/26/2020    1:23 PM  Fall Risk   Falls in the past year? 0 0 0 0 0  Number falls in past yr: 0 0 0 0 0  Injury with Fall? 0 0 0 0 0  Risk for fall due to :  No Fall Risks No Fall Risks No Fall Risks   Follow up  Falls prevention discussed Falls prevention discussed Falls prevention discussed Falls prevention discussed     Assessment & Plan  Problem List Items Addressed This Visit     GERD (gastroesophageal reflux disease)    On PPI and symptoms controlled        Relevant Medications   omeprazole (PRILOSEC) 40 MG capsule   ADD (attention deficit disorder)    Resume Adderal XR 15 mg daily        Relevant Medications   amphetamine-dextroamphetamine (ADDERALL XR) 15 MG 24 hr capsule   amphetamine-dextroamphetamine (ADDERALL XR) 15 MG 24 hr capsule   amphetamine-dextroamphetamine (ADDERALL XR) 15 MG  24 hr capsule   Essential hypertension, benign - Primary   Relevant Medications   amLODipine (NORVASC) 5 MG tablet   Other Visit Diagnoses     Elevated uric acid in blood       Relevant Medications   allopurinol (ZYLOPRIM) 100 MG tablet          I discussed the assessment and treatment plan with the patient. The patient was provided an opportunity to ask questions and all were answered. The patient agreed with the plan and demonstrated an understanding of the instructions.   The patient was advised to call back or seek an in-person evaluation if the symptoms worsen or if the condition fails to improve as anticipated.  I provided 25  minutes of non-face-to-face time during this encounter.  Royal Hawthorn, CMA

## 2021-10-31 ENCOUNTER — Telehealth: Payer: Self-pay

## 2021-10-31 ENCOUNTER — Ambulatory Visit (INDEPENDENT_AMBULATORY_CARE_PROVIDER_SITE_OTHER): Payer: 59 | Admitting: Family Medicine

## 2021-10-31 ENCOUNTER — Encounter: Payer: Self-pay | Admitting: Family Medicine

## 2021-10-31 ENCOUNTER — Other Ambulatory Visit: Payer: Self-pay

## 2021-10-31 VITALS — Wt 201.8 lb

## 2021-10-31 DIAGNOSIS — F902 Attention-deficit hyperactivity disorder, combined type: Secondary | ICD-10-CM

## 2021-10-31 DIAGNOSIS — Z1211 Encounter for screening for malignant neoplasm of colon: Secondary | ICD-10-CM

## 2021-10-31 DIAGNOSIS — I10 Essential (primary) hypertension: Secondary | ICD-10-CM

## 2021-10-31 DIAGNOSIS — E790 Hyperuricemia without signs of inflammatory arthritis and tophaceous disease: Secondary | ICD-10-CM

## 2021-10-31 DIAGNOSIS — K219 Gastro-esophageal reflux disease without esophagitis: Secondary | ICD-10-CM

## 2021-10-31 MED ORDER — AMPHETAMINE-DEXTROAMPHET ER 15 MG PO CP24: 15.0000 mg | ORAL_CAPSULE | ORAL | 0 refills | Status: DC

## 2021-10-31 MED ORDER — AMLODIPINE BESYLATE 5 MG PO TABS: 5.0000 mg | ORAL_TABLET | Freq: Every day | ORAL | 1 refills | Status: DC

## 2021-10-31 MED ORDER — ALLOPURINOL 100 MG PO TABS: 100.0000 mg | ORAL_TABLET | Freq: Every day | ORAL | 3 refills | Status: DC

## 2021-10-31 MED ORDER — NA SULFATE-K SULFATE-MG SULF 17.5-3.13-1.6 GM/177ML PO SOLN: 1.0000 | Freq: Once | ORAL | 0 refills | Status: AC

## 2021-10-31 MED ORDER — OMEPRAZOLE 40 MG PO CPDR: 40.0000 mg | DELAYED_RELEASE_CAPSULE | Freq: Every day | ORAL | 1 refills | Status: DC

## 2021-10-31 NOTE — Assessment & Plan Note (Signed)
Refill Allopurinol 

## 2021-10-31 NOTE — Assessment & Plan Note (Signed)
Resume Adderal XR 15 mg daily

## 2021-10-31 NOTE — Telephone Encounter (Signed)
Gastroenterology Pre-Procedure Review  Request Date: 11/21/21 Requesting Physician: Dr. Allegra Lai  PATIENT REVIEW QUESTIONS: The patient responded to the following health history questions as indicated:    1. Are you having any GI issues? no 2. Do you have a personal history of Polyps? no 3. Do you have a family history of Colon Cancer or Polyps?  Polyps maybe 4. Diabetes Mellitus? no 5. Joint replacements in the past 12 months?no 6. Major health problems in the past 3 months?no 7. Any artificial heart valves, MVP, or defibrillator?no    MEDICATIONS & ALLERGIES:    Patient reports the following regarding taking any anticoagulation/antiplatelet therapy:   Plavix, Coumadin, Eliquis, Xarelto, Lovenox, Pradaxa, Brilinta, or Effient? no Aspirin? no  Patient confirms/reports the following medications:  Current Outpatient Medications  Medication Sig Dispense Refill   allopurinol (ZYLOPRIM) 100 MG tablet Take 1 tablet (100 mg total) by mouth daily. 90 tablet 3   amLODipine (NORVASC) 5 MG tablet Take 1 tablet (5 mg total) by mouth daily. 90 tablet 1   amphetamine-dextroamphetamine (ADDERALL XR) 15 MG 24 hr capsule Take 1 capsule by mouth every morning. 30 capsule 0   amphetamine-dextroamphetamine (ADDERALL XR) 15 MG 24 hr capsule Take 1 capsule by mouth every morning. 30 capsule 0   amphetamine-dextroamphetamine (ADDERALL XR) 15 MG 24 hr capsule Take 1 capsule by mouth every morning. 30 capsule 0   Azelastine HCl 137 MCG/SPRAY SOLN PLACE 1 SPRAY INTO BOTH NOSTRILS 2 (TWO) TIMES DAILY. USE IN EACH NOSTRIL AS DIRECTED 30 mL 1   benzonatate (TESSALON) 200 MG capsule Take 1 capsule (200 mg total) by mouth at bedtime. 30 capsule 0   cromolyn (OPTICROM) 4 % ophthalmic solution Place 1 drop into both eyes 4 (four) times daily. 10 mL 12   fluticasone (FLONASE) 50 MCG/ACT nasal spray PLACE 1 SPRAY INTO BOTH NOSTRILS EVERY EVENING. 16 mL 2   omeprazole (PRILOSEC) 40 MG capsule Take 1 capsule (40 mg total)  by mouth daily. 90 capsule 1   umeclidinium-vilanterol (ANORO ELLIPTA) 62.5-25 MCG/ACT AEPB Inhale 1 puff into the lungs daily at 6 (six) AM. 1 each 0   Vitamin D, Ergocalciferol, (DRISDOL) 1.25 MG (50000 UNIT) CAPS capsule TAKE 1 CAPSULE (50,000 UNITS TOTAL) BY MOUTH EVERY 7 (SEVEN) DAYS 12 capsule 1   No current facility-administered medications for this visit.    Patient confirms/reports the following allergies:  No Known Allergies  No orders of the defined types were placed in this encounter.   AUTHORIZATION INFORMATION Primary Insurance: 1D#: Group #:  Secondary Insurance: 1D#: Group #:  SCHEDULE INFORMATION: Date: 11/21/21 Time: Location: [facility]

## 2021-10-31 NOTE — Assessment & Plan Note (Signed)
On PPI and symptoms controlled

## 2021-10-31 NOTE — Assessment & Plan Note (Signed)
On Norvasc and no side effects

## 2021-11-05 ENCOUNTER — Telehealth: Payer: Self-pay

## 2021-11-05 NOTE — Telephone Encounter (Signed)
Patient want to be rescheduled till 12/06/2021 called endo and sent new letter and new refferral

## 2021-12-06 ENCOUNTER — Ambulatory Visit
Admission: RE | Admit: 2021-12-06 | Payer: BC Managed Care – PPO | Source: Home / Self Care | Admitting: Gastroenterology

## 2021-12-06 ENCOUNTER — Encounter: Admission: RE | Payer: Self-pay | Source: Home / Self Care

## 2021-12-06 SURGERY — COLONOSCOPY WITH PROPOFOL
Anesthesia: General

## 2021-12-09 ENCOUNTER — Telehealth: Payer: Self-pay

## 2021-12-09 NOTE — Telephone Encounter (Signed)
Received staff message from Dr. Allegra Lai patient did not show up for his scheduled colonoscopy.  Called patient this morning to inquire why he did not show up.  He stated that he started new job and could not make it.  Messaged Dr. Allegra Lai back to let her know his reason for not showing up.  He is a no show for scheduled colonoscopy which he requested reschedule to this date.  Cancellation Fee will be applied.  Thanks, [place], [place]

## 2021-12-20 ENCOUNTER — Encounter: Payer: Self-pay | Admitting: Family Medicine

## 2021-12-20 ENCOUNTER — Other Ambulatory Visit: Payer: Self-pay | Admitting: Family Medicine

## 2021-12-20 DIAGNOSIS — E790 Hyperuricemia without signs of inflammatory arthritis and tophaceous disease: Secondary | ICD-10-CM

## 2021-12-20 MED ORDER — ALLOPURINOL 100 MG PO TABS: 100.0000 mg | ORAL_TABLET | Freq: Every day | ORAL | 5 refills | Status: DC

## 2022-01-13 ENCOUNTER — Other Ambulatory Visit: Payer: Self-pay | Admitting: Family Medicine

## 2022-01-13 DIAGNOSIS — F902 Attention-deficit hyperactivity disorder, combined type: Secondary | ICD-10-CM

## 2022-01-24 NOTE — Progress Notes (Unsigned)
Name: Victor Jackson   MRN: [id number]    DOB: 1976-02-10   Date:01/27/2022       Progress Note  Subjective  Chief Complaint  Follow Up  HPI  HTN: he has been taking norasc 5 mg , denies side effects, no chest pain, palpitation or orthostatic changes . Last BP when he came in to the office was at goal     GERD: taking PPI, no heartburn or indigestion since taking medications daily to be able to control symptoms .    ADHD: son has ADHD, he was diagnosed at the time that his son was diagnosed with this condition about 13 years ago. He states medication helps him focus at work, he missed a visit and has been out of Adderal for the past couple of months, he has noticed difficulty staying on task, he would like to resume taking it . No side effects   Gout: no recent episodes   Patient Active Problem List   Diagnosis Date Noted   Pre-diabetes 08/19/2019   Dyslipidemia 08/19/2019   Essential hypertension, benign 08/19/2019   Pes planus 09/11/2016   Capsulitis of left foot 09/11/2016   Controlled gout 05/02/2016   Umbilical hernia 11/27/2015   GERD (gastroesophageal reflux disease) 11/27/2015   Thrombocytopenia ([facility]) 11/27/2015   Vitamin D deficiency 11/27/2015   Hypertension, benign 11/27/2015   ADD (attention deficit disorder) 11/27/2015   History of hand fracture     Past Surgical History:  Procedure Laterality Date   KNEE ARTHROSCOPY Left 1998   none      Family History  Problem Relation Age of Onset   Arthritis Mother    Diabetes Mother    Hypertension Mother    COPD Father    Hypertension Father     Social History   Tobacco Use   Smoking status: Former    Packs/day: 0.00    Years: 1.00    Total pack years: 0.00    Types: Cigars, Cigarettes    Quit date: 06/28/2014    Years since quitting: 7.5   Smokeless tobacco: Never  Substance Use Topics   Alcohol use: Yes    Alcohol/week: 1.0 standard drink of alcohol    Types: 1 Cans of beer per week    Comment: rarely      Current Outpatient Medications:    allopurinol (ZYLOPRIM) 100 MG tablet, Take 1 tablet (100 mg total) by mouth daily., Disp: 30 tablet, Rfl: 5   amLODipine (NORVASC) 5 MG tablet, Take 1 tablet (5 mg total) by mouth daily., Disp: 90 tablet, Rfl: 1   amphetamine-dextroamphetamine (ADDERALL XR) 15 MG 24 hr capsule, Take 1 capsule by mouth every morning., Disp: 30 capsule, Rfl: 0   amphetamine-dextroamphetamine (ADDERALL XR) 15 MG 24 hr capsule, Take 1 capsule by mouth every morning., Disp: 30 capsule, Rfl: 0   amphetamine-dextroamphetamine (ADDERALL XR) 15 MG 24 hr capsule, Take 1 capsule by mouth every morning., Disp: 30 capsule, Rfl: 0   Azelastine HCl 137 MCG/SPRAY SOLN, PLACE 1 SPRAY INTO BOTH NOSTRILS 2 (TWO) TIMES DAILY. USE IN EACH NOSTRIL AS DIRECTED, Disp: 30 mL, Rfl: 1   cromolyn (OPTICROM) 4 % ophthalmic solution, Place 1 drop into both eyes 4 (four) times daily., Disp: 10 mL, Rfl: 12   fluticasone (FLONASE) 50 MCG/ACT nasal spray, PLACE 1 SPRAY INTO BOTH NOSTRILS EVERY EVENING., Disp: 16 mL, Rfl: 2   omeprazole (PRILOSEC) 40 MG capsule, Take 1 capsule (40 mg total) by mouth daily., Disp: 90 capsule, Rfl:  1   Vitamin D, Ergocalciferol, (DRISDOL) 1.25 MG (50000 UNIT) CAPS capsule, TAKE 1 CAPSULE (50,000 UNITS TOTAL) BY MOUTH EVERY 7 (SEVEN) DAYS, Disp: 12 capsule, Rfl: 1  No Known Allergies  I personally reviewed active problem list, medication list, allergies, family history, social history, health maintenance with the patient/caregiver today.   ROS  Ten systems reviewed and is negative except as mentioned in HPI   Objective  Vitals:   01/27/22 1524  BP: 128/76  Pulse: 75  Resp: 16  SpO2: 99%  Weight: 207 lb (93.9 kg)  Height: 6\' 3"  (1.905 m)    Body mass index is 25.87 kg/m.  Physical Exam  Constitutional: Patient appears well-developed and well-nourished.  No distress.  HEENT: head atraumatic, normocephalic, pupils equal and reactive to light, neck  supple Cardiovascular: Normal rate, regular rhythm and normal heart sounds.  No murmur heard. No BLE edema. Pulmonary/Chest: Effort normal and breath sounds normal. No respiratory distress. Abdominal: Soft.  There is no tenderness. Psychiatric: Patient has a normal mood and affect. behavior is normal. Judgment and thought content normal.   PHQ2/9:    01/27/2022    3:25 PM 10/31/2021    7:55 AM 09/19/2021    2:13 PM 08/12/2021    3:31 PM 07/11/2021    9:18 AM  Depression screen PHQ 2/9  Decreased Interest 0 0 0 0 0  Down, Depressed, Hopeless 0 0 0 0 0  PHQ - 2 Score 0 0 0 0 0  Altered sleeping 0 0 0 0 0  Tired, decreased energy 0 0 0 0 0  Change in appetite 0 0 0 0 0  Feeling bad or failure about yourself  0 0 0 0 0  Trouble concentrating 0 0 0 0 0  Moving slowly or fidgety/restless 0 0 0 0 0  Suicidal thoughts 0 0 0 0 0  PHQ-9 Score 0 0 0 0 0  Difficult doing work/chores   Not difficult at all      phq 9 is negative   Fall Risk:    01/27/2022    3:25 PM 10/31/2021    7:55 AM 09/19/2021    2:11 PM 08/12/2021    3:31 PM 07/11/2021    9:17 AM  Fall Risk   Falls in the past year? 0 0 0 0 0  Number falls in past yr: 0  0 0 0  Injury with Fall? 0  0 0 0  Risk for fall due to : No Fall Risks No Fall Risks  No Fall Risks No Fall Risks  Follow up Falls prevention discussed Falls prevention discussed  Falls prevention discussed Falls prevention discussed      Functional Status Survey: Is the patient deaf or have difficulty hearing?: No Does the patient have difficulty seeing, even when wearing glasses/contacts?: No Does the patient have difficulty concentrating, remembering, or making decisions?: No Does the patient have difficulty walking or climbing stairs?: No Does the patient have difficulty dressing or bathing?: No Does the patient have difficulty doing errands alone such as visiting a doctor's office or shopping?: No    Assessment & Plan  1. Attention deficit  hyperactivity disorder (ADHD), combined type  - amphetamine-dextroamphetamine (ADDERALL XR) 15 MG 24 hr capsule; Take 1 capsule by mouth every morning.  Dispense: 30 capsule; Refill: 0 - amphetamine-dextroamphetamine (ADDERALL XR) 15 MG 24 hr capsule; Take 1 capsule by mouth every morning.  Dispense: 30 capsule; Refill: 0 - amphetamine-dextroamphetamine (ADDERALL XR) 15 MG 24 hr capsule;  Take 1 capsule by mouth every morning.  Dispense: 30 capsule; Refill: 0  2. Elevated uric acid in blood  - allopurinol (ZYLOPRIM) 100 MG tablet; Take 1 tablet (100 mg total) by mouth daily.  Dispense: 90 tablet; Refill: 1  3. Gastroesophageal reflux disease without esophagitis   4. Dyslipidemia   5. Pre-diabetes

## 2022-01-27 ENCOUNTER — Ambulatory Visit (INDEPENDENT_AMBULATORY_CARE_PROVIDER_SITE_OTHER): Payer: BC Managed Care – PPO | Admitting: Family Medicine

## 2022-01-27 ENCOUNTER — Encounter: Payer: Self-pay | Admitting: Family Medicine

## 2022-01-27 VITALS — BP 128/76 | HR 75 | Resp 16 | Ht 75.0 in | Wt 207.0 lb

## 2022-01-27 DIAGNOSIS — R7303 Prediabetes: Secondary | ICD-10-CM

## 2022-01-27 DIAGNOSIS — K219 Gastro-esophageal reflux disease without esophagitis: Secondary | ICD-10-CM

## 2022-01-27 DIAGNOSIS — F902 Attention-deficit hyperactivity disorder, combined type: Secondary | ICD-10-CM | POA: Diagnosis not present

## 2022-01-27 DIAGNOSIS — E785 Hyperlipidemia, unspecified: Secondary | ICD-10-CM | POA: Diagnosis not present

## 2022-01-27 DIAGNOSIS — E790 Hyperuricemia without signs of inflammatory arthritis and tophaceous disease: Secondary | ICD-10-CM

## 2022-01-27 MED ORDER — AMPHETAMINE-DEXTROAMPHET ER 15 MG PO CP24: 15.0000 mg | ORAL_CAPSULE | ORAL | 0 refills | Status: DC

## 2022-01-27 MED ORDER — ALLOPURINOL 100 MG PO TABS: 100.0000 mg | ORAL_TABLET | Freq: Every day | ORAL | 1 refills | Status: DC

## 2022-04-28 NOTE — Progress Notes (Unsigned)
Name: Victor Jackson   MRN: [id number]    DOB: 1975-07-17   Date:04/29/2022       Progress Note  Subjective  Chief Complaint  Follow Up  HPI  HTN: he has been taking norasc 5 mg , denies side effects, no chest pain, palpitation or orthostatic changes . BP has been at goal.    GERD: taking PPI,however insurance is no longer covering Omeprazole and he has been taking otc medication, symptoms are not controlled , he said insurance covers a medication that starts with a P. We will send pantoprazole but explained he can use goodrx if not covered. He has heartburn and indigestion.     ADHD: son has ADHD, he was diagnosed at the time that his son was diagnosed with this condition about 13 years ago. He states medication helps him focus at work, he states current dose of Adderal 15 mg XR does not seem to control his symptoms any longer, we will try switching to vyvanse 30 mg to avoid late afternoon crash also    Gout: no recent episodes   Patient Active Problem List   Diagnosis Date Noted   Pre-diabetes 08/19/2019   Dyslipidemia 08/19/2019   Essential hypertension, benign 08/19/2019   Pes planus 09/11/2016   Controlled gout 05/02/2016   Umbilical hernia 11/27/2015   GERD (gastroesophageal reflux disease) 11/27/2015   Thrombocytopenia (HCC) 11/27/2015   Vitamin D deficiency 11/27/2015   Hypertension, benign 11/27/2015   ADD (attention deficit disorder) 11/27/2015   History of hand fracture     Past Surgical History:  Procedure Laterality Date   KNEE ARTHROSCOPY Left 1998   none      Family History  Problem Relation Age of Onset   Arthritis Mother    Diabetes Mother    Hypertension Mother    COPD Father    Hypertension Father     Social History   Tobacco Use   Smoking status: Former    Packs/day: 0.00    Years: 1.00    Total pack years: 0.00    Types: Cigars, Cigarettes    Quit date: 06/28/2014    Years since quitting: 7.8   Smokeless tobacco: Never  Substance Use Topics    Alcohol use: Yes    Alcohol/week: 1.0 standard drink of alcohol    Types: 1 Cans of beer per week    Comment: rarely     Current Outpatient Medications:    allopurinol (ZYLOPRIM) 100 MG tablet, Take 1 tablet (100 mg total) by mouth daily., Disp: 90 tablet, Rfl: 1   amLODipine (NORVASC) 5 MG tablet, Take 1 tablet (5 mg total) by mouth daily., Disp: 90 tablet, Rfl: 1   amphetamine-dextroamphetamine (ADDERALL XR) 15 MG 24 hr capsule, Take 1 capsule by mouth every morning., Disp: 30 capsule, Rfl: 0   amphetamine-dextroamphetamine (ADDERALL XR) 15 MG 24 hr capsule, Take 1 capsule by mouth every morning., Disp: 30 capsule, Rfl: 0   amphetamine-dextroamphetamine (ADDERALL XR) 15 MG 24 hr capsule, Take 1 capsule by mouth every morning., Disp: 30 capsule, Rfl: 0   Azelastine HCl 137 MCG/SPRAY SOLN, PLACE 1 SPRAY INTO BOTH NOSTRILS 2 (TWO) TIMES DAILY. USE IN EACH NOSTRIL AS DIRECTED, Disp: 30 mL, Rfl: 1   fluticasone (FLONASE) 50 MCG/ACT nasal spray, PLACE 1 SPRAY INTO BOTH NOSTRILS EVERY EVENING., Disp: 16 mL, Rfl: 2   Vitamin D, Ergocalciferol, (DRISDOL) 1.25 MG (50000 UNIT) CAPS capsule, TAKE 1 CAPSULE (50,000 UNITS TOTAL) BY MOUTH EVERY 7 (SEVEN) DAYS, Disp: 12  capsule, Rfl: 1   cromolyn (OPTICROM) 4 % ophthalmic solution, Place 1 drop into both eyes 4 (four) times daily. (Patient not taking: Reported on 04/29/2022), Disp: 10 mL, Rfl: 12   omeprazole (PRILOSEC) 40 MG capsule, Take 1 capsule (40 mg total) by mouth daily. (Patient not taking: Reported on 04/29/2022), Disp: 90 capsule, Rfl: 1  No Known Allergies  I personally reviewed active problem list, medication list, allergies, family history, social history, health maintenance with the patient/caregiver today.   ROS  Ten systems reviewed and is negative except as mentioned in HPI    Objective  Vitals:   04/29/22 1505  BP: 118/78  Pulse: 94  Resp: 16  Temp: 98.4 F (36.9 C)  TempSrc: Oral  SpO2: 96%  Weight: 200 lb (90.7 kg)   Height: 6\' 3"  (1.905 m)    Body mass index is 25 kg/m.  Physical Exam  Constitutional: Patient appears well-developed and well-nourished. No distress.  HEENT: head atraumatic, normocephalic, pupils equal and reactive to light, neck supple Cardiovascular: Normal rate, regular rhythm and normal heart sounds.  No murmur heard. No BLE edema. Pulmonary/Chest: Effort normal and breath sounds normal. No respiratory distress. Abdominal: Soft.  There is no tenderness. Psychiatric: Patient has a normal mood and affect. behavior is normal. Judgment and thought content normal.    PHQ2/9:    04/29/2022    3:08 PM 01/27/2022    3:25 PM 10/31/2021    7:55 AM 09/19/2021    2:13 PM 08/12/2021    3:31 PM  Depression screen PHQ 2/9  Decreased Interest 0 0 0 0 0  Down, Depressed, Hopeless 0 0 0 0 0  PHQ - 2 Score 0 0 0 0 0  Altered sleeping 0 0 0 0 0  Tired, decreased energy 0 0 0 0 0  Change in appetite 0 0 0 0 0  Feeling bad or failure about yourself  0 0 0 0 0  Trouble concentrating 0 0 0 0 0  Moving slowly or fidgety/restless 0 0 0 0 0  Suicidal thoughts 0 0 0 0 0  PHQ-9 Score 0 0 0 0 0  Difficult doing work/chores    Not difficult at all     phq 9 is negative   Fall Risk:    04/29/2022    3:08 PM 01/27/2022    3:25 PM 10/31/2021    7:55 AM 09/19/2021    2:11 PM 08/12/2021    3:31 PM  Fall Risk   Falls in the past year? 0 0 0 0 0  Number falls in past yr:  0  0 0  Injury with Fall?  0  0 0  Risk for fall due to : No Fall Risks No Fall Risks No Fall Risks  No Fall Risks  Follow up Falls prevention discussed;Education provided;Falls evaluation completed Falls prevention discussed Falls prevention discussed  Falls prevention discussed      Functional Status Survey: Is the patient deaf or have difficulty hearing?: No Does the patient have difficulty seeing, even when wearing glasses/contacts?: No Does the patient have difficulty concentrating, remembering, or making decisions?:  No Does the patient have difficulty walking or climbing stairs?: No Does the patient have difficulty dressing or bathing?: No Does the patient have difficulty doing errands alone such as visiting a doctor's office or shopping?: No    Assessment & Plan  1. Essential hypertension, benign  - amLODipine (NORVASC) 5 MG tablet; Take 1 tablet (5 mg total) by mouth daily.  Dispense: 90 tablet; Refill: 1  2. Attention deficit hyperactivity disorder (ADHD), combined type  - lisdexamfetamine (VYVANSE) 30 MG capsule; Take 1 capsule (30 mg total) by mouth daily.  Dispense: 30 capsule; Refill: 0  3. Gastroesophageal reflux disease without esophagitis  - pantoprazole (PROTONIX) 40 MG tablet; Take 1 tablet (40 mg total) by mouth daily.  Dispense: 90 tablet; Refill: 1  4. Pre-diabetes   5. Elevated uric acid in blood   6. Colon cancer screening  - Cologuard - Fecal Globin By [employer]

## 2022-04-29 ENCOUNTER — Ambulatory Visit (INDEPENDENT_AMBULATORY_CARE_PROVIDER_SITE_OTHER): Payer: BC Managed Care – PPO | Admitting: Family Medicine

## 2022-04-29 ENCOUNTER — Encounter: Payer: Self-pay | Admitting: Family Medicine

## 2022-04-29 VITALS — BP 118/78 | HR 94 | Temp 98.4°F | Resp 16 | Ht 75.0 in | Wt 200.0 lb

## 2022-04-29 DIAGNOSIS — Z1211 Encounter for screening for malignant neoplasm of colon: Secondary | ICD-10-CM

## 2022-04-29 DIAGNOSIS — K219 Gastro-esophageal reflux disease without esophagitis: Secondary | ICD-10-CM | POA: Diagnosis not present

## 2022-04-29 DIAGNOSIS — R7303 Prediabetes: Secondary | ICD-10-CM | POA: Diagnosis not present

## 2022-04-29 DIAGNOSIS — F902 Attention-deficit hyperactivity disorder, combined type: Secondary | ICD-10-CM

## 2022-04-29 DIAGNOSIS — I10 Essential (primary) hypertension: Secondary | ICD-10-CM

## 2022-04-29 MED ORDER — LISDEXAMFETAMINE DIMESYLATE 30 MG PO CAPS: 30.0000 mg | ORAL_CAPSULE | Freq: Every day | ORAL | 0 refills | Status: DC

## 2022-04-29 MED ORDER — PANTOPRAZOLE SODIUM 40 MG PO TBEC: 40.0000 mg | DELAYED_RELEASE_TABLET | Freq: Every day | ORAL | 1 refills | Status: DC

## 2022-04-29 MED ORDER — AMLODIPINE BESYLATE 5 MG PO TABS: 5.0000 mg | ORAL_TABLET | Freq: Every day | ORAL | 1 refills | Status: DC

## 2022-06-02 ENCOUNTER — Other Ambulatory Visit: Payer: Self-pay

## 2022-06-02 ENCOUNTER — Encounter: Payer: Self-pay | Admitting: Family Medicine

## 2022-06-02 DIAGNOSIS — F902 Attention-deficit hyperactivity disorder, combined type: Secondary | ICD-10-CM

## 2022-06-02 MED ORDER — LISDEXAMFETAMINE DIMESYLATE 30 MG PO CAPS: 30.0000 mg | ORAL_CAPSULE | Freq: Every day | ORAL | 0 refills | Status: DC

## 2022-07-07 ENCOUNTER — Other Ambulatory Visit: Payer: Self-pay | Admitting: Family Medicine

## 2022-07-07 DIAGNOSIS — F902 Attention-deficit hyperactivity disorder, combined type: Secondary | ICD-10-CM

## 2022-07-07 MED ORDER — LISDEXAMFETAMINE DIMESYLATE 30 MG PO CAPS: 30.0000 mg | ORAL_CAPSULE | Freq: Every day | ORAL | 0 refills | Status: DC

## 2022-07-29 NOTE — Progress Notes (Unsigned)
Name: Victor Jackson   MRN: [id number]    DOB: [birth year]   Date:07/30/2022       Progress Note  Subjective  Chief Complaint  Follow Up  HPI  HTN: he has been taking norasc 5 mg , denies side effects, no chest pain, palpitation or orthostatic changes . BP is at goal  GERD: he is taking Pantoprazole and symptoms are controlled again, denies heartburn or indigestion at this time    ADHD: son also has ADHD, he was diagnosed at the time that his son was diagnosed with this condition about 14  years ago. He states medication helps him focus at work, he states current dose of Adderal 15 mg XR was not working, we switched to Vyvanse and he does not like , asked to go back ton Adderal XR and if needed we can adjust dose next visit    Gout: no recent episodes , he takes allopurinol   Patient Active Problem List   Diagnosis Date Noted   Pre-diabetes 08/19/2019   Dyslipidemia 08/19/2019   Essential hypertension, benign 08/19/2019   Pes planus 09/11/2016   Controlled gout [id number]   Umbilical hernia [id number]   GERD (gastroesophageal reflux disease) 11/27/2015   Thrombocytopenia ([place]) 11/27/2015   Vitamin D deficiency 11/27/2015   Hypertension, benign 11/27/2015   ADD (attention deficit disorder) 11/27/2015   History of hand fracture     Past Surgical History:  Procedure Laterality Date   KNEE ARTHROSCOPY Left 1998   none      Family History  Problem Relation Age of Onset   Arthritis Mother    Diabetes Mother    Hypertension Mother    COPD Father    Hypertension Father     Social History   Tobacco Use   Smoking status: Former    Packs/day: 0.00    Years: 1.00    Total pack years: 0.00    Types: Cigars, Cigarettes    Quit date: 06/28/2014    Years since quitting: 8.0   Smokeless tobacco: Never  Substance Use Topics   Alcohol use: Yes    Alcohol/week: 1.0 standard drink of alcohol    Types: 1 Cans of beer per week    Comment: rarely     Current Outpatient  Medications:    allopurinol (ZYLOPRIM) 100 MG tablet, Take 1 tablet (100 mg total) by mouth daily., Disp: 90 tablet, Rfl: 1   amLODipine (NORVASC) 5 MG tablet, Take 1 tablet (5 mg total) by mouth daily., Disp: 90 tablet, Rfl: 1   Azelastine HCl 137 MCG/SPRAY SOLN, PLACE 1 SPRAY INTO BOTH NOSTRILS 2 (TWO) TIMES DAILY. USE IN EACH NOSTRIL AS DIRECTED, Disp: 30 mL, Rfl: 1   fluticasone (FLONASE) 50 MCG/ACT nasal spray, PLACE 1 SPRAY INTO BOTH NOSTRILS EVERY EVENING., Disp: 16 mL, Rfl: 2   lisdexamfetamine (VYVANSE) 30 MG capsule, Take 1 capsule (30 mg total) by mouth daily., Disp: 30 capsule, Rfl: 0   pantoprazole (PROTONIX) 40 MG tablet, Take 1 tablet (40 mg total) by mouth daily., Disp: 90 tablet, Rfl: 1   Vitamin D, Ergocalciferol, (DRISDOL) 1.25 MG (50000 UNIT) CAPS capsule, TAKE 1 CAPSULE (50,000 UNITS TOTAL) BY MOUTH EVERY 7 (SEVEN) DAYS, Disp: 12 capsule, Rfl: 1  No Known Allergies  I personally reviewed active problem list, medication list, allergies, family history, social history, health maintenance with the patient/caregiver today.   ROS  Ten systems reviewed and is negative except as mentioned in HPI   Objective  Vitals:  07/30/22 1334  BP: 130/82  Pulse: 95  Resp: 16  SpO2: 98%  Weight: 198 lb (89.8 kg)  Height: 6' 3"$  (1.905 m)    Body mass index is 24.75 kg/m.  Physical Exam  Constitutional: Patient appears well-developed and well-nourished.  No distress.  HEENT: head atraumatic, normocephalic, pupils equal and reactive to light, neck supple Cardiovascular: Normal rate, regular rhythm and normal heart sounds.  No murmur heard. No BLE edema. Pulmonary/Chest: Effort normal and breath sounds normal. No respiratory distress. Abdominal: Soft.  There is no tenderness. Psychiatric: Patient has a normal mood and affect. behavior is normal. Judgment and thought content normal.    PHQ2/9:    07/30/2022    1:33 PM 04/29/2022    3:08 PM 01/27/2022    3:25 PM 10/31/2021     7:55 AM 09/19/2021    2:13 PM  Depression screen PHQ 2/9  Decreased Interest 0 0 0 0 0  Down, Depressed, Hopeless 0 0 0 0 0  PHQ - 2 Score 0 0 0 0 0  Altered sleeping 0 0 0 0 0  Tired, decreased energy 0 0 0 0 0  Change in appetite 0 0 0 0 0  Feeling bad or failure about yourself  0 0 0 0 0  Trouble concentrating 0 0 0 0 0  Moving slowly or fidgety/restless 0 0 0 0 0  Suicidal thoughts 0 0 0 0 0  PHQ-9 Score 0 0 0 0 0  Difficult doing work/chores     Not difficult at all    phq 9 is negative   Fall Risk:    07/30/2022    1:33 PM 04/29/2022    3:08 PM 01/27/2022    3:25 PM 10/31/2021    7:55 AM 09/19/2021    2:11 PM  Fall Risk   Falls in the past year? 0 0 0 0 0  Number falls in past yr: 0  0  0  Injury with Fall? 0  0  0  Risk for fall due to : No Fall Risks No Fall Risks No Fall Risks No Fall Risks   Follow up Falls prevention discussed Falls prevention discussed;Education provided;Falls evaluation completed Falls prevention discussed Falls prevention discussed       Functional Status Survey: Is the patient deaf or have difficulty hearing?: No Does the patient have difficulty seeing, even when wearing glasses/contacts?: No Does the patient have difficulty concentrating, remembering, or making decisions?: No Does the patient have difficulty walking or climbing stairs?: No Does the patient have difficulty dressing or bathing?: No Does the patient have difficulty doing errands alone such as visiting a doctor's office or shopping?: No    Assessment & Plan   1. Elevated uric acid in blood  - allopurinol (ZYLOPRIM) 100 MG tablet; Take 1 tablet (100 mg total) by mouth daily.  Dispense: 90 tablet; Refill: 1  2. Essential hypertension, benign  At goal   3. Pre-diabetes  Weight is good , we will monitor labs  4. Gastroesophageal reflux disease without esophagitis  Controlled  5. Attention deficit hyperactivity disorder (ADHD), combined type  -  amphetamine-dextroamphetamine (ADDERALL XR) 15 MG 24 hr capsule; Take 1 capsule by mouth every morning.  Dispense: 30 capsule; Refill: 0 - amphetamine-dextroamphetamine (ADDERALL XR) 15 MG 24 hr capsule; Take 1 capsule by mouth every morning.  Dispense: 30 capsule; Refill: 0 - amphetamine-dextroamphetamine (ADDERALL XR) 15 MG 24 hr capsule; Take 1 capsule by mouth every morning.  Dispense: 30 capsule; Refill:  0  6. Vitamin D deficiency  Advised to take otc vitamin D 2000 units

## 2022-07-30 ENCOUNTER — Encounter: Payer: Self-pay | Admitting: Family Medicine

## 2022-07-30 ENCOUNTER — Ambulatory Visit: Payer: BC Managed Care – PPO | Admitting: Family Medicine

## 2022-07-30 VITALS — BP 130/82 | HR 95 | Resp 16 | Ht 75.0 in | Wt 198.0 lb

## 2022-07-30 DIAGNOSIS — I10 Essential (primary) hypertension: Secondary | ICD-10-CM

## 2022-07-30 DIAGNOSIS — R7303 Prediabetes: Secondary | ICD-10-CM | POA: Diagnosis not present

## 2022-07-30 DIAGNOSIS — E790 Hyperuricemia without signs of inflammatory arthritis and tophaceous disease: Secondary | ICD-10-CM

## 2022-07-30 DIAGNOSIS — E559 Vitamin D deficiency, unspecified: Secondary | ICD-10-CM

## 2022-07-30 DIAGNOSIS — K219 Gastro-esophageal reflux disease without esophagitis: Secondary | ICD-10-CM | POA: Diagnosis not present

## 2022-07-30 DIAGNOSIS — F902 Attention-deficit hyperactivity disorder, combined type: Secondary | ICD-10-CM

## 2022-07-30 MED ORDER — AMPHETAMINE-DEXTROAMPHET ER 15 MG PO CP24: 15.0000 mg | ORAL_CAPSULE | ORAL | 0 refills | Status: DC

## 2022-07-30 MED ORDER — ALLOPURINOL 100 MG PO TABS: 100.0000 mg | ORAL_TABLET | Freq: Every day | ORAL | 1 refills | Status: DC

## 2022-10-23 ENCOUNTER — Other Ambulatory Visit: Payer: Self-pay | Admitting: Family Medicine

## 2022-10-23 DIAGNOSIS — K219 Gastro-esophageal reflux disease without esophagitis: Secondary | ICD-10-CM

## 2022-10-27 NOTE — Progress Notes (Unsigned)
Name: Victor Jackson   MRN: [id number]    DOB: 06-13-1976   Date:10/28/2022       Progress Note  Subjective  Chief Complaint  Follow Up  HPI  HTN: he has been taking norasc 5 mg , denies side effects, no chest pain, palpitation or orthostatic changes . BP is slightly elevated, since we will adjust dose of Adderal from 15 to 20 mg we will also add ARB   GERD: he is taking Pantoprazole and symptoms are controlled again, denies heartburn or indigestion . Discussed trying to skip doses due to possible side effects     ADHD: son also has ADHD, he was diagnosed at the time that his son was diagnosed with this condition about 14  years ago. He states medication helps him focus at work, he used to take Adderal 15 mg XR and stopped working, so  we switched to Vyvanse and he did not like the medication - it made him feel weird, so he asked to go back ton Adderal XR , he has been taking 15 mg for the past few months and states not lasting all day, we will adjust dose to 20 mg and he will return in a few weeks for bp check and see how he is feeling    Gout: no recent episodes , he takes allopurinol . Unchanged   Pre diabetes: he  tries to eat a healthy diet   Thrombocytopenia : he is due for repeat labs but wants to hold off for now  Elevated ANA and knee pain: seen by Dr. Allena Katz and advised topical medication, no synovitis   Patient Active Problem List   Diagnosis Date Noted   Pre-diabetes 08/19/2019   Dyslipidemia 08/19/2019   Essential hypertension, benign 08/19/2019   Pes planus 09/11/2016   Controlled gout 05/02/2016   Umbilical hernia 11/27/2015   GERD (gastroesophageal reflux disease) 11/27/2015   Thrombocytopenia (HCC) 11/27/2015   Vitamin D deficiency 11/27/2015   Hypertension, benign 11/27/2015   ADD (attention deficit disorder) 11/27/2015   History of hand fracture     Past Surgical History:  Procedure Laterality Date   KNEE ARTHROSCOPY Left 1998   none      Family History   Problem Relation Age of Onset   Arthritis Mother    Diabetes Mother    Hypertension Mother    COPD Father    Hypertension Father     Social History   Tobacco Use   Smoking status: Former    Packs/day: 0.00    Years: 1.00    Additional pack years: 0.00    Total pack years: 0.00    Types: Cigars, Cigarettes    Quit date: 06/28/2014    Years since quitting: 8.3   Smokeless tobacco: Never  Substance Use Topics   Alcohol use: Yes    Alcohol/week: 1.0 standard drink of alcohol    Types: 1 Cans of beer per week    Comment: rarely     Current Outpatient Medications:    allopurinol (ZYLOPRIM) 100 MG tablet, Take 1 tablet (100 mg total) by mouth daily., Disp: 90 tablet, Rfl: 1   amLODipine (NORVASC) 5 MG tablet, Take 1 tablet (5 mg total) by mouth daily., Disp: 90 tablet, Rfl: 1   amphetamine-dextroamphetamine (ADDERALL XR) 15 MG 24 hr capsule, Take 1 capsule by mouth every morning., Disp: 30 capsule, Rfl: 0   amphetamine-dextroamphetamine (ADDERALL XR) 15 MG 24 hr capsule, Take 1 capsule by mouth every morning., Disp: 30 capsule,  Rfl: 0   amphetamine-dextroamphetamine (ADDERALL XR) 15 MG 24 hr capsule, Take 1 capsule by mouth every morning., Disp: 30 capsule, Rfl: 0   Azelastine HCl 137 MCG/SPRAY SOLN, PLACE 1 SPRAY INTO BOTH NOSTRILS 2 (TWO) TIMES DAILY. USE IN EACH NOSTRIL AS DIRECTED, Disp: 30 mL, Rfl: 1   Cholecalciferol (VITAMIN D) 50 MCG (2000 UT) CAPS, Take 1 capsule by mouth daily at 12 noon., Disp: , Rfl:    fluticasone (FLONASE) 50 MCG/ACT nasal spray, PLACE 1 SPRAY INTO BOTH NOSTRILS EVERY EVENING., Disp: 16 mL, Rfl: 2   pantoprazole (PROTONIX) 40 MG tablet, Take 1 tablet by mouth once daily, Disp: 90 tablet, Rfl: 0  No Known Allergies  I personally reviewed active problem list, medication list, allergies, family history, social history, health maintenance with the patient/caregiver today.   ROS  Ten systems reviewed and is negative except as mentioned in HPI    Objective  Vitals:   10/28/22 1337  BP: 136/86  Pulse: 91  Resp: 16  SpO2: 97%  Weight: 202 lb (91.6 kg)  Height: 6\' 3"  (1.905 m)    Body mass index is 25.25 kg/m.  Physical Exam  Constitutional: Patient appears well-developed and well-nourished.  No distress.  HEENT: head atraumatic, normocephalic, pupils equal and reactive to light, neck supple Cardiovascular: Normal rate, regular rhythm and normal heart sounds.  No murmur heard. No BLE edema. Pulmonary/Chest: Effort normal and breath sounds normal. No respiratory distress. Abdominal: Soft.  There is no tenderness. Psychiatric: Patient has a normal mood and affect. behavior is normal. Judgment and thought content normal.   No results found for this or any previous visit (from the past 2160 hour(s)).   PHQ2/9:    10/28/2022    1:37 PM 07/30/2022    1:33 PM 04/29/2022    3:08 PM 01/27/2022    3:25 PM 10/31/2021    7:55 AM  Depression screen PHQ 2/9  Decreased Interest 0 0 0 0 0  Down, Depressed, Hopeless 0 0 0 0 0  PHQ - 2 Score 0 0 0 0 0  Altered sleeping 0 0 0 0 0  Tired, decreased energy 0 0 0 0 0  Change in appetite 0 0 0 0 0  Feeling bad or failure about yourself  0 0 0 0 0  Trouble concentrating 0 0 0 0 0  Moving slowly or fidgety/restless 0 0 0 0 0  Suicidal thoughts 0 0 0 0 0  PHQ-9 Score 0 0 0 0 0    phq 9 is negative   Fall Risk:    10/28/2022    1:37 PM 07/30/2022    1:33 PM 04/29/2022    3:08 PM 01/27/2022    3:25 PM 10/31/2021    7:55 AM  Fall Risk   Falls in the past year? 0 0 0 0 0  Number falls in past yr: 0 0  0   Injury with Fall? 0 0  0   Risk for fall due to : No Fall Risks No Fall Risks No Fall Risks No Fall Risks No Fall Risks  Follow up Falls prevention discussed Falls prevention discussed Falls prevention discussed;Education provided;Falls evaluation completed Falls prevention discussed Falls prevention discussed      Functional Status Survey: Is the patient deaf or have  difficulty hearing?: No Does the patient have difficulty seeing, even when wearing glasses/contacts?: No Does the patient have difficulty concentrating, remembering, or making decisions?: No Does the patient have difficulty walking or climbing stairs?:  No Does the patient have difficulty dressing or bathing?: No Does the patient have difficulty doing errands alone such as visiting a doctor's office or shopping?: No    Assessment & Plan  1. Gastroesophageal reflux disease without esophagitis  He will try to skip PPI   2. Essential hypertension, benign  - amLODipine-valsartan (EXFORGE) 5-160 MG tablet; Take 1 tablet by mouth daily.  Dispense: 30 tablet; Refill: 0  3. Pre-diabetes  Reviewed low carb diet   4. Attention deficit hyperactivity disorder (ADHD), combined type  - amphetamine-dextroamphetamine (ADDERALL XR) 20 MG 24 hr capsule; Take 1 capsule (20 mg total) by mouth every morning.  Dispense: 30 capsule; Refill: 0  5. Thrombocytopenia (HCC)  Recheck during CPE  6. Controlled gout   7. Positive ANA (antinuclear antibody)

## 2022-10-28 ENCOUNTER — Encounter: Payer: Self-pay | Admitting: Family Medicine

## 2022-10-28 ENCOUNTER — Ambulatory Visit: Payer: BC Managed Care – PPO | Admitting: Family Medicine

## 2022-10-28 VITALS — BP 136/86 | HR 91 | Resp 16 | Ht 75.0 in | Wt 202.0 lb

## 2022-10-28 DIAGNOSIS — F902 Attention-deficit hyperactivity disorder, combined type: Secondary | ICD-10-CM | POA: Diagnosis not present

## 2022-10-28 DIAGNOSIS — I10 Essential (primary) hypertension: Secondary | ICD-10-CM

## 2022-10-28 DIAGNOSIS — K219 Gastro-esophageal reflux disease without esophagitis: Secondary | ICD-10-CM

## 2022-10-28 DIAGNOSIS — D696 Thrombocytopenia, unspecified: Secondary | ICD-10-CM

## 2022-10-28 DIAGNOSIS — R768 Other specified abnormal immunological findings in serum: Secondary | ICD-10-CM

## 2022-10-28 DIAGNOSIS — R7303 Prediabetes: Secondary | ICD-10-CM

## 2022-10-28 DIAGNOSIS — M109 Gout, unspecified: Secondary | ICD-10-CM

## 2022-10-28 MED ORDER — AMLODIPINE BESYLATE-VALSARTAN 5-160 MG PO TABS: 1.0000 | ORAL_TABLET | Freq: Every day | ORAL | 0 refills | Status: DC

## 2022-10-28 MED ORDER — AMPHETAMINE-DEXTROAMPHET ER 20 MG PO CP24
20.0000 mg | ORAL_CAPSULE | ORAL | 0 refills | Status: DC
Start: 2022-10-28 — End: 2022-11-20

## 2022-11-19 NOTE — Progress Notes (Signed)
Name: Victor Jackson   MRN: [id number]    DOB: 1975-07-17   Date:11/20/2022       Progress Note  Subjective  Chief Complaint  Annual Exam  HPI  Patient presents for annual CPE.  IPSS Questionnaire (AUA-7): Over the past month.   1)  How often have you had a sensation of not emptying your bladder completely after you finish urinating?  0 - Not at all  2)  How often have you had to urinate again less than two hours after you finished urinating? 0 - Not at all  3)  How often have you found you stopped and started again several times when you urinated?  0 - Not at all  4) How difficult have you found it to postpone urination?  0 - Not at all  5) How often have you had a weak urinary stream?  0 - Not at all  6) How often have you had to push or strain to begin urination?  0 - Not at all  7) How many times did you most typically get up to urinate from the time you went to bed until the time you got up in the morning?  0 - None  Total score:  0-7 mildly symptomatic   8-19 moderately symptomatic   20-35 severely symptomatic     Diet: eats at home most of the time, lots of chicken, needs to increase fruit and vegetables  Exercise: only at work  Last Dental Exam: he is due for a visit  Last Eye Exam: he is due for an exam   Depression: phq 9 is negative    11/20/2022    9:53 AM 10/28/2022    1:37 PM 07/30/2022    1:33 PM 04/29/2022    3:08 PM 01/27/2022    3:25 PM  Depression screen PHQ 2/9  Decreased Interest 0 0 0 0 0  Down, Depressed, Hopeless 0 0 0 0 0  PHQ - 2 Score 0 0 0 0 0  Altered sleeping 0 0 0 0 0  Tired, decreased energy 0 0 0 0 0  Change in appetite 0 0 0 0 0  Feeling bad or failure about yourself  0 0 0 0 0  Trouble concentrating 0 0 0 0 0  Moving slowly or fidgety/restless 0 0 0 0 0  Suicidal thoughts 0 0 0 0 0  PHQ-9 Score 0 0 0 0 0    Hypertension:  BP Readings from Last 3 Encounters:  11/20/22 118/70  10/28/22 136/86  07/30/22 130/82    Obesity: Wt Readings  from Last 3 Encounters:  11/20/22 202 lb (91.6 kg)  10/28/22 202 lb (91.6 kg)  07/30/22 198 lb (89.8 kg)   BMI Readings from Last 3 Encounters:  11/20/22 25.25 kg/m  10/28/22 25.25 kg/m  07/30/22 24.75 kg/m     Lipids:  Lab Results  Component Value Date   CHOL 150 07/11/2021   CHOL 145 04/17/2020   CHOL 189 04/01/2019   Lab Results  Component Value Date   HDL 62 07/11/2021   HDL 51 04/17/2020   HDL 51 04/01/2019   Lab Results  Component Value Date   LDLCALC 74 07/11/2021   LDLCALC 76 04/17/2020   LDLCALC 112 (H) 04/01/2019   Lab Results  Component Value Date   TRIG 55 07/11/2021   TRIG 94 04/17/2020   TRIG 145 04/01/2019   Lab Results  Component Value Date   CHOLHDL 2.4 07/11/2021   CHOLHDL  2.8 04/17/2020   CHOLHDL 3.7 04/01/2019   No results found for: "LDLDIRECT" Glucose:  Glucose, Bld  Date Value Ref Range Status  07/11/2021 87 65 - 99 mg/dL Final    Comment:    .            Fasting reference interval .   11/26/2020 114 (H) 65 - 99 mg/dL Final    Comment:    .            Fasting reference interval . For someone without known diabetes, a glucose value between 100 and 125 mg/dL is consistent with prediabetes and should be confirmed with a follow-up test. .   04/17/2020 85 65 - 99 mg/dL Final    Comment:    .            Fasting reference interval .     Flowsheet Row Office Visit from 07/30/2020 in [facility]  AUDIT-C Score 0      Married STD testing and prevention (HIV/chl/gon/syphilis): N/A Sexual history: one partner, no problems  Hep C Screening: 04/17/20 Skin cancer: Discussed monitoring for atypical lesions Colorectal cancer: N/A Prostate cancer:  N/A   Lung cancer:  Low Dose CT Chest recommended if Age 80-80 years, 30 pack-year currently smoking OR have quit w/in 15years. Patient  no a candidate for screening   AAA: The USPSTF recommends one-time screening with ultrasonography in men ages 60 to 75  years who have ever smoked. Patient   no, a candidate for screening  ECG:  04/01/19  Vaccines:   Tdap: up to date Shingrix: N/A Pneumonia: N/A Flu: N/A COVID-19: N/A  Advanced Care Planning: A voluntary discussion about advance care planning including the explanation and discussion of advance directives.  Discussed health care proxy and Living will, and the patient was able to identify a health care proxy as wife.  Patient does not have a living will and power of attorney of health care   Patient Active Problem List   Diagnosis Date Noted   Pre-diabetes 08/19/2019   Dyslipidemia 08/19/2019   Essential hypertension, benign 08/19/2019   Pes planus 09/11/2016   Controlled gout 05/02/2016   Umbilical hernia 11/27/2015   GERD (gastroesophageal reflux disease) 11/27/2015   Thrombocytopenia (HCC) 11/27/2015   Vitamin D deficiency 11/27/2015   Hypertension, benign 11/27/2015   ADD (attention deficit disorder) 11/27/2015   History of hand fracture     Past Surgical History:  Procedure Laterality Date   KNEE ARTHROSCOPY Left 1998   none      Family History  Problem Relation Age of Onset   Arthritis Mother    Diabetes Mother    Hypertension Mother    COPD Father    Hypertension Father     Social History   Socioeconomic History   Marital status: Married    Spouse name: [name]   Number of children: Not on file   Years of education: Not on file   Highest education level: Not on file  Occupational History   Not on file  Tobacco Use   Smoking status: Former    Packs/day: 0.00    Years: 1.00    Additional pack years: 0.00    Total pack years: 0.00    Types: Cigars, Cigarettes    Quit date: 06/28/2014    Years since quitting: 8.4   Smokeless tobacco: Never  Vaping Use   Vaping Use: Never used  Substance and Sexual Activity   Alcohol use: Yes  Alcohol/week: 1.0 standard drink of alcohol    Types: 1 Cans of beer per week    Comment: rarely   Drug use: No   Sexual  activity: Yes    Partners: Female    Birth control/protection: Other-see comments    Comment: girlfriend had tubal ligation   Other Topics Concern   Not on file  Social History Narrative   Lives with girlfriend [name] ) and their 3 children.    Working at [employer], Heritage manager.    Social Determinants of Health   Financial Resource Strain: Low Risk  (11/20/2022)   Overall Financial Resource Strain (CARDIA)    Difficulty of Paying Living Expenses: Not hard at all  Food Insecurity: No Food Insecurity (11/20/2022)   Hunger Vital Sign    Worried About Running Out of Food in the Last Year: Never true    Ran Out of Food in the Last Year: Never true  Transportation Needs: No Transportation Needs (11/20/2022)   PRAPARE - Administrator, Civil Service (Medical): No    Lack of Transportation (Non-Medical): No  Physical Activity: Sufficiently Active (11/20/2022)   Exercise Vital Sign    Days of Exercise per Week: 5 days    Minutes of Exercise per Session: 90 min  Stress: No Stress Concern Present (11/20/2022)   [employer] of Occupational Health - Occupational Stress Questionnaire    Feeling of Stress : Not at all  Social Connections: Socially Integrated (11/20/2022)   Social Connection and Isolation Panel [NHANES]    Frequency of Communication with Friends and Family: More than three times a week    Frequency of Social Gatherings with Friends and Family: Once a week    Attends Religious Services: More than 4 times per year    Active Member of [employer] or Organizations: Yes    Attends Engineer, structural: More than 4 times per year    Marital Status: Married  Catering manager Violence: Not At Risk (11/20/2022)   Humiliation, Afraid, Rape, and Kick questionnaire    Fear of Current or Ex-Partner: No    Emotionally Abused: No    Physically Abused: No    Sexually Abused: No     Current Outpatient Medications:    allopurinol (ZYLOPRIM) 100 MG tablet, Take 1  tablet (100 mg total) by mouth daily., Disp: 90 tablet, Rfl: 1   amLODipine-valsartan (EXFORGE) 5-160 MG tablet, Take 1 tablet by mouth daily., Disp: 30 tablet, Rfl: 0   amphetamine-dextroamphetamine (ADDERALL XR) 20 MG 24 hr capsule, Take 1 capsule (20 mg total) by mouth every morning., Disp: 30 capsule, Rfl: 0   Azelastine HCl 137 MCG/SPRAY SOLN, PLACE 1 SPRAY INTO BOTH NOSTRILS 2 (TWO) TIMES DAILY. USE IN EACH NOSTRIL AS DIRECTED, Disp: 30 mL, Rfl: 1   Cholecalciferol (VITAMIN D) 50 MCG (2000 UT) CAPS, Take 1 capsule by mouth daily at 12 noon., Disp: , Rfl:    fluticasone (FLONASE) 50 MCG/ACT nasal spray, PLACE 1 SPRAY INTO BOTH NOSTRILS EVERY EVENING., Disp: 16 mL, Rfl: 2   pantoprazole (PROTONIX) 40 MG tablet, Take 1 tablet by mouth once daily, Disp: 90 tablet, Rfl: 0  No Known Allergies   ROS  Constitutional: Negative for fever or weight change.  Respiratory: Negative for cough and shortness of breath.   Cardiovascular: Negative for chest pain or palpitations.  Gastrointestinal: Negative for abdominal pain, no bowel changes.  Musculoskeletal: Negative for gait problem or joint swelling.  Skin: Negative for rash.  Neurological: Negative for dizziness or headache.  No other specific complaints in a complete review of systems (except as listed in HPI above).    Objective  Vitals:   11/20/22 0959  BP: 118/70  Pulse: 92  Resp: 16  SpO2: 98%  Weight: 202 lb (91.6 kg)  Height: 6\' 3"  (1.905 m)    Body mass index is 25.25 kg/m.  Physical Exam  Constitutional: Patient appears well-developed and well-nourished. No distress.  HENT: Head: Normocephalic and atraumatic. Ears: B TMs ok, no erythema or effusion; Nose: Nose normal. Mouth/Throat: Oropharynx is clear and moist. No oropharyngeal exudate.  Eyes: Conjunctivae and EOM are normal. Pupils are equal, round, and reactive to light. No scleral icterus.  Neck: Normal range of motion. Neck supple. No JVD present. No thyromegaly  present.  Cardiovascular: Normal rate, regular rhythm and normal heart sounds.  No murmur heard. No BLE edema. Pulmonary/Chest: Effort normal and breath sounds normal. No respiratory distress. Abdominal: Soft. Bowel sounds are normal, no distension, umbilical hernia present. There is no tenderness. no masses MALE GENITALIA: Normal descended testes bilaterally, no masses palpated,  umbilical hernia and weak right inguinal area/ early hernia?  no lesions, no discharge RECTAL:not done  Musculoskeletal: Normal range of motion, no joint effusions. No gross deformities Neurological: he is alert and oriented to person, place, and time. No cranial nerve deficit. Coordination, balance, strength, speech and gait are normal.  Skin: Skin is warm and dry. No rash noted. No erythema.  Psychiatric: Patient has a normal mood and affect. behavior is normal. Judgment and thought content normal.    Fall Risk:    11/20/2022    9:53 AM 10/28/2022    1:37 PM 07/30/2022    1:33 PM 04/29/2022    3:08 PM 01/27/2022    3:25 PM  Fall Risk   Falls in the past year? 0 0 0 0 0  Number falls in past yr: 0 0 0  0  Injury with Fall? 0 0 0  0  Risk for fall due to : No Fall Risks No Fall Risks No Fall Risks No Fall Risks No Fall Risks  Follow up Falls prevention discussed Falls prevention discussed Falls prevention discussed Falls prevention discussed;Education provided;Falls evaluation completed Falls prevention discussed     Functional Status Survey: Is the patient deaf or have difficulty hearing?: No Does the patient have difficulty seeing, even when wearing glasses/contacts?: No Does the patient have difficulty concentrating, remembering, or making decisions?: No Does the patient have difficulty walking or climbing stairs?: No Does the patient have difficulty dressing or bathing?: No Does the patient have difficulty doing errands alone such as visiting a doctor's office or shopping?: No    Assessment &  Plan  1. Well adult exam  - Lipid panel - CBC with Differential/Platelet - COMPLETE METABOLIC PANEL WITH GFR - Hemoglobin A1c  2. Thrombocytopenia (HCC)  - CBC with Differential/Platelet  3. Dyslipidemia  - Lipid panel  4. Long-term use of high-risk medication  - COMPLETE METABOLIC PANEL WITH GFR    -Prostate cancer screening and PSA options (with potential risks and benefits of testing vs not testing) were discussed along with recent recs/guidelines. -USPSTF grade A and B recommendations reviewed with patient; age-appropriate recommendations, preventive care, screening tests, etc discussed and encouraged; healthy living encouraged; see AVS for patient education given to patient -Discussed importance of 150 minutes of physical activity weekly, eat two servings of fish weekly, eat one serving of tree nuts ( cashews, pistachios, pecans,  almonds.Marland Kitchen) every other day, eat 6 servings of fruit/vegetables daily and drink plenty of water and avoid sweet beverages.  -Reviewed Health Maintenance: yes

## 2022-11-19 NOTE — Patient Instructions (Signed)

## 2022-11-20 ENCOUNTER — Ambulatory Visit (INDEPENDENT_AMBULATORY_CARE_PROVIDER_SITE_OTHER): Payer: BC Managed Care – PPO | Admitting: Family Medicine

## 2022-11-20 ENCOUNTER — Encounter: Payer: Self-pay | Admitting: Family Medicine

## 2022-11-20 ENCOUNTER — Other Ambulatory Visit: Payer: Self-pay

## 2022-11-20 VITALS — BP 118/70 | HR 92 | Resp 16 | Ht 75.0 in | Wt 202.0 lb

## 2022-11-20 DIAGNOSIS — E785 Hyperlipidemia, unspecified: Secondary | ICD-10-CM | POA: Diagnosis not present

## 2022-11-20 DIAGNOSIS — F902 Attention-deficit hyperactivity disorder, combined type: Secondary | ICD-10-CM

## 2022-11-20 DIAGNOSIS — D696 Thrombocytopenia, unspecified: Secondary | ICD-10-CM | POA: Diagnosis not present

## 2022-11-20 DIAGNOSIS — Z79899 Other long term (current) drug therapy: Secondary | ICD-10-CM

## 2022-11-20 DIAGNOSIS — K429 Umbilical hernia without obstruction or gangrene: Secondary | ICD-10-CM

## 2022-11-20 DIAGNOSIS — Z0000 Encounter for general adult medical examination without abnormal findings: Secondary | ICD-10-CM | POA: Diagnosis not present

## 2022-11-20 LAB — CBC WITH DIFFERENTIAL/PLATELET
HCT: 43.8 % (ref 38.5–50.0)
Hemoglobin: 14.8 g/dL (ref 13.2–17.1)
MCH: 30.9 pg (ref 27.0–33.0)
MCHC: 33.8 g/dL (ref 32.0–36.0)
MCV: 91.4 fL (ref 80.0–100.0)
Monocytes Relative: 9.4 %
RBC: 4.79 10*6/uL (ref 4.20–5.80)
Total Lymphocyte: 27.3 %
WBC: 5 10*3/uL (ref 3.8–10.8)

## 2022-11-20 MED ORDER — AMPHETAMINE-DEXTROAMPHET ER 20 MG PO CP24: 20.0000 mg | ORAL_CAPSULE | ORAL | 0 refills | Status: DC

## 2022-11-21 LAB — COMPLETE METABOLIC PANEL WITH GFR
AG Ratio: 1.7 (calc) (ref 1.0–2.5)
ALT: 20 U/L (ref 9–46)
AST: 22 U/L (ref 10–40)
Albumin: 4.4 g/dL (ref 3.6–5.1)
Alkaline phosphatase (APISO): 45 U/L (ref 36–130)
BUN/Creatinine Ratio: 8 (calc) (ref 6–22)
BUN: 11 mg/dL (ref 7–25)
CO2: 28 mmol/L (ref 20–32)
Calcium: 9.5 mg/dL (ref 8.6–10.3)
Chloride: 104 mmol/L (ref 98–110)
Creat: 1.3 mg/dL — ABNORMAL HIGH (ref 0.60–1.29)
Globulin: 2.6 g/dL (calc) (ref 1.9–3.7)
Glucose, Bld: 91 mg/dL (ref 65–99)
Potassium: 4 mmol/L (ref 3.5–5.3)
Sodium: 140 mmol/L (ref 135–146)
Total Bilirubin: 1.3 mg/dL — ABNORMAL HIGH (ref 0.2–1.2)
Total Protein: 7 g/dL (ref 6.1–8.1)
eGFR: 68 mL/min/{1.73_m2} (ref >=60)

## 2022-11-21 LAB — HEMOGLOBIN A1C
Hgb A1c MFr Bld: 5.5 % of total Hgb (ref <5.7)
Mean Plasma Glucose: 111 mg/dL
eAG (mmol/L): 6.2 mmol/L

## 2022-11-21 LAB — CBC WITH DIFFERENTIAL/PLATELET
Absolute Monocytes: 470 cells/uL (ref 200–950)
Basophils Absolute: 20 cells/uL (ref 0–200)
Basophils Relative: 0.4 %
Eosinophils Absolute: 20 cells/uL (ref 15–500)
Eosinophils Relative: 0.4 %
Lymphs Abs: 1365 cells/uL (ref 850–3900)
MPV: 13.2 fL — ABNORMAL HIGH (ref 7.5–12.5)
Neutro Abs: 3125 cells/uL (ref 1500–7800)
Neutrophils Relative %: 62.5 %
Platelets: 137 10*3/uL — ABNORMAL LOW (ref 140–400)
RDW: 12.2 % (ref 11.0–15.0)

## 2022-11-21 LAB — LIPID PANEL
Cholesterol: 151 mg/dL (ref <200)
HDL: 64 mg/dL (ref >=40)
LDL Cholesterol (Calc): 69 mg/dL (calc)
Non-HDL Cholesterol (Calc): 87 mg/dL (calc) (ref <130)
Total CHOL/HDL Ratio: 2.4 (calc) (ref <5.0)
Triglycerides: 92 mg/dL (ref <150)

## 2022-12-08 ENCOUNTER — Other Ambulatory Visit: Payer: Self-pay | Admitting: Family Medicine

## 2022-12-08 DIAGNOSIS — I10 Essential (primary) hypertension: Secondary | ICD-10-CM

## 2022-12-08 DIAGNOSIS — F902 Attention-deficit hyperactivity disorder, combined type: Secondary | ICD-10-CM

## 2022-12-08 MED ORDER — AMPHETAMINE-DEXTROAMPHET ER 20 MG PO CP24
20.0000 mg | ORAL_CAPSULE | ORAL | 0 refills | Status: DC
Start: 2022-12-08 — End: 2023-01-26

## 2022-12-08 MED ORDER — AMLODIPINE BESYLATE-VALSARTAN 5-160 MG PO TABS
1.0000 | ORAL_TABLET | Freq: Every day | ORAL | 0 refills | Status: DC
Start: 2022-12-08 — End: 2023-01-07

## 2022-12-31 ENCOUNTER — Other Ambulatory Visit: Payer: Self-pay

## 2022-12-31 ENCOUNTER — Encounter: Payer: Self-pay | Admitting: Family Medicine

## 2022-12-31 DIAGNOSIS — E790 Hyperuricemia without signs of inflammatory arthritis and tophaceous disease: Secondary | ICD-10-CM

## 2022-12-31 MED ORDER — ALLOPURINOL 100 MG PO TABS: 100.0000 mg | ORAL_TABLET | Freq: Every day | ORAL | 1 refills | Status: DC

## 2023-01-07 ENCOUNTER — Other Ambulatory Visit: Payer: Self-pay | Admitting: Family Medicine

## 2023-01-07 DIAGNOSIS — I10 Essential (primary) hypertension: Secondary | ICD-10-CM

## 2023-01-21 ENCOUNTER — Ambulatory Visit: Payer: BC Managed Care – PPO | Admitting: Family Medicine

## 2023-01-23 NOTE — Progress Notes (Unsigned)
Name: Victor Jackson   MRN: [id number]    DOB: [birth year]   Date:01/23/2023       Progress Note  Subjective  Chief Complaint  Follow Up  HPI  HTN: he has been taking norasc 5 mg , denies side effects, no chest pain, palpitation or orthostatic changes . BP is slightly elevated, since we will adjust dose of Adderal from 15 to 20 mg we will also add ARB   GERD: he is taking Pantoprazole and symptoms are controlled again, denies heartburn or indigestion . Discussed trying to skip doses due to possible side effects     ADHD: son also has ADHD, he was diagnosed at the time that his son was diagnosed with this condition about 14  years ago. He states medication helps him focus at work, he used to take Adderal 15 mg XR and stopped working, so  we switched to Vyvanse and he did not like the medication - it made him feel weird, so he asked to go back ton Adderal XR , he has been taking 15 mg for the past few months and states not lasting all day, we will adjust dose to 20 mg and he will return in a few weeks for bp check and see how he is feeling    Gout: no recent episodes , he takes allopurinol . Unchanged   Pre diabetes: he  tries to eat a healthy diet   Thrombocytopenia : he is due for repeat labs but wants to hold off for now  Elevated ANA and knee pain: seen by Dr. Allena Katz and advised topical medication, no synovitis   Patient Active Problem List   Diagnosis Date Noted   Pre-diabetes 08/19/2019   Dyslipidemia 08/19/2019   Essential hypertension, benign 08/19/2019   Pes planus 09/11/2016   Controlled gout 05/02/2016   Umbilical hernia 11/27/2015   GERD (gastroesophageal reflux disease) 11/27/2015   Thrombocytopenia (HCC) 11/27/2015   Vitamin D deficiency 11/27/2015   Hypertension, benign 11/27/2015   ADD (attention deficit disorder) 11/27/2015   History of hand fracture     Past Surgical History:  Procedure Laterality Date   KNEE ARTHROSCOPY Left 1998   none      Family History   Problem Relation Age of Onset   Arthritis Mother    Diabetes Mother    Hypertension Mother    COPD Father    Hypertension Father     Social History   Tobacco Use   Smoking status: Former    Current packs/day: 0.00    Types: Cigars, Cigarettes    Quit date: 06/28/2013    Years since quitting: 9.5   Smokeless tobacco: Never  Substance Use Topics   Alcohol use: Yes    Alcohol/week: 1.0 standard drink of alcohol    Types: 1 Cans of beer per week    Comment: rarely     Current Outpatient Medications:    allopurinol (ZYLOPRIM) 100 MG tablet, Take 1 tablet (100 mg total) by mouth daily., Disp: 90 tablet, Rfl: 1   amLODipine-valsartan (EXFORGE) 5-160 MG tablet, Take 1 tablet by mouth once daily, Disp: 90 tablet, Rfl: 0   amphetamine-dextroamphetamine (ADDERALL XR) 20 MG 24 hr capsule, Take 1 capsule (20 mg total) by mouth every morning., Disp: 30 capsule, Rfl: 0   Azelastine HCl 137 MCG/SPRAY SOLN, PLACE 1 SPRAY INTO BOTH NOSTRILS 2 (TWO) TIMES DAILY. USE IN EACH NOSTRIL AS DIRECTED, Disp: 30 mL, Rfl: 1   Cholecalciferol (VITAMIN D) 50 MCG (2000 UT)  CAPS, Take 1 capsule by mouth daily at 12 noon., Disp: , Rfl:    fluticasone (FLONASE) 50 MCG/ACT nasal spray, PLACE 1 SPRAY INTO BOTH NOSTRILS EVERY EVENING., Disp: 16 mL, Rfl: 2   pantoprazole (PROTONIX) 40 MG tablet, Take 1 tablet by mouth once daily, Disp: 90 tablet, Rfl: 0  No Known Allergies  I personally reviewed active problem list, medication list, allergies, family history, social history, health maintenance with the patient/caregiver today.   ROS  ***  Objective  There were no vitals filed for this visit.  There is no height or weight on file to calculate BMI.  Physical Exam ***   PHQ2/9:    11/20/2022    9:53 AM 10/28/2022    1:37 PM 07/30/2022    1:33 PM 04/29/2022    3:08 PM 01/27/2022    3:25 PM  Depression screen PHQ 2/9  Decreased Interest 0 0 0 0 0  Down, Depressed, Hopeless 0 0 0 0 0  PHQ - 2 Score 0  0 0 0 0  Altered sleeping 0 0 0 0 0  Tired, decreased energy 0 0 0 0 0  Change in appetite 0 0 0 0 0  Feeling bad or failure about yourself  0 0 0 0 0  Trouble concentrating 0 0 0 0 0  Moving slowly or fidgety/restless 0 0 0 0 0  Suicidal thoughts 0 0 0 0 0  PHQ-9 Score 0 0 0 0 0    phq 9 is {gen pos [id number]}   Fall Risk:    11/20/2022    9:53 AM 10/28/2022    1:37 PM 07/30/2022    1:33 PM 04/29/2022    3:08 PM 01/27/2022    3:25 PM  Fall Risk   Falls in the past year? 0 0 0 0 0  Number falls in past yr: 0 0 0  0  Injury with Fall? 0 0 0  0  Risk for fall due to : No Fall Risks No Fall Risks No Fall Risks No Fall Risks No Fall Risks  Follow up Falls prevention discussed Falls prevention discussed Falls prevention discussed Falls prevention discussed;Education provided;Falls evaluation completed Falls prevention discussed      Functional Status Survey:      Assessment & Plan  *** There are no diagnoses linked to this encounter.

## 2023-01-26 ENCOUNTER — Encounter: Payer: Self-pay | Admitting: Family Medicine

## 2023-01-26 ENCOUNTER — Ambulatory Visit: Payer: BC Managed Care – PPO | Admitting: Family Medicine

## 2023-01-26 VITALS — BP 124/78 | HR 98 | Resp 16 | Ht 75.0 in | Wt 198.0 lb

## 2023-01-26 DIAGNOSIS — R0981 Nasal congestion: Secondary | ICD-10-CM

## 2023-01-26 DIAGNOSIS — I10 Essential (primary) hypertension: Secondary | ICD-10-CM

## 2023-01-26 DIAGNOSIS — E785 Hyperlipidemia, unspecified: Secondary | ICD-10-CM

## 2023-01-26 DIAGNOSIS — F902 Attention-deficit hyperactivity disorder, combined type: Secondary | ICD-10-CM

## 2023-01-26 DIAGNOSIS — D696 Thrombocytopenia, unspecified: Secondary | ICD-10-CM

## 2023-01-26 DIAGNOSIS — E559 Vitamin D deficiency, unspecified: Secondary | ICD-10-CM

## 2023-01-26 DIAGNOSIS — K219 Gastro-esophageal reflux disease without esophagitis: Secondary | ICD-10-CM

## 2023-01-26 DIAGNOSIS — Z1211 Encounter for screening for malignant neoplasm of colon: Secondary | ICD-10-CM

## 2023-01-26 DIAGNOSIS — M109 Gout, unspecified: Secondary | ICD-10-CM

## 2023-01-26 MED ORDER — AMPHETAMINE-DEXTROAMPHET ER 20 MG PO CP24
20.0000 mg | ORAL_CAPSULE | ORAL | 0 refills | Status: DC
Start: 2023-01-26 — End: 2023-04-30

## 2023-01-26 MED ORDER — PANTOPRAZOLE SODIUM 40 MG PO TBEC
40.0000 mg | DELAYED_RELEASE_TABLET | Freq: Every day | ORAL | 1 refills | Status: DC
Start: 2023-01-26 — End: 2023-07-28

## 2023-01-26 MED ORDER — AMLODIPINE BESYLATE-VALSARTAN 5-160 MG PO TABS
1.0000 | ORAL_TABLET | Freq: Every day | ORAL | 1 refills | Status: DC
Start: 2023-01-26 — End: 2023-08-04

## 2023-01-26 MED ORDER — AZELASTINE HCL 137 MCG/SPRAY NA SOLN
2.0000 | NASAL | 1 refills | Status: AC
Start: 2023-01-26 — End: ?

## 2023-01-26 MED ORDER — FLUTICASONE PROPIONATE 50 MCG/ACT NA SUSP
1.0000 | Freq: Every evening | NASAL | 2 refills | Status: AC
Start: 2023-01-26 — End: ?

## 2023-02-03 ENCOUNTER — Encounter: Payer: Self-pay | Admitting: *Deleted

## 2023-04-29 ENCOUNTER — Ambulatory Visit: Payer: BC Managed Care – PPO | Admitting: Family Medicine

## 2023-04-29 NOTE — Progress Notes (Unsigned)
Name: Victor Jackson   MRN: [id number]    DOB: [birth year]   Date:04/30/2023       Progress Note  Subjective  Chief Complaint  Follow Up  HPI  HTN: He is now taking Exforge and tolerating it well, bp is elevated today, he also has noticed intermittent palpitation but not associated with SOB or chest pain. We will check TSH since he has also been losing weight  Left arm pain: he noticed some pain on left elbow that radiates to left shoulder over the past few days, triggered by certain movements.   GERD: he is taking Pantoprazole and symptoms are controlled again, denies heartburn or indigestion .Skips medication occasionally   ADHD: son also has ADHD, he was diagnosed at the time that his son was diagnosed with this condition about 14  years ago. He states medication helps him focus at work, he used to take Adderal 15 mg XR and stopped working, so  we switched to Vyvanse and he did not like the medication - it made him feel weird, so he asked to go back to Adderal XR , he is currently on 20 mg dose . We will send refills    Gout: no recent episodes , he takes allopurinol daily , last uric acid was at goal .  Pre diabetes:  last abnormal A1C was a few years ago, he is doing well, losing weight by choice, avoiding snacks throughout the day   Thrombocytopenia :level is stable now, improving, reviewed with patient, denies easy bleeding . Unchanged   Elevated ANA and knee pain: seen by Dr. Allena Katz and advised topical medication, no synovitis . Stable   Patient Active Problem List   Diagnosis Date Noted   Pre-diabetes 08/19/2019   Dyslipidemia 08/19/2019   Essential hypertension, benign 08/19/2019   Pes planus 09/11/2016   Controlled gout 05/02/2016   Umbilical hernia 11/27/2015   GERD (gastroesophageal reflux disease) 11/27/2015   Thrombocytopenia (HCC) 11/27/2015   Vitamin D deficiency 11/27/2015   Hypertension, benign 11/27/2015   ADD (attention deficit disorder) 11/27/2015   History of  hand fracture     Past Surgical History:  Procedure Laterality Date   KNEE ARTHROSCOPY Left 1998   none      Family History  Problem Relation Age of Onset   Arthritis Mother    Diabetes Mother    Hypertension Mother    COPD Father    Hypertension Father     Social History   Tobacco Use   Smoking status: Former    Current packs/day: 0.00    Types: Cigars, Cigarettes    Quit date: 06/28/2013    Years since quitting: 9.8   Smokeless tobacco: Never  Substance Use Topics   Alcohol use: Yes    Alcohol/week: 1.0 standard drink of alcohol    Types: 1 Cans of beer per week    Comment: rarely     Current Outpatient Medications:    allopurinol (ZYLOPRIM) 100 MG tablet, Take 1 tablet (100 mg total) by mouth daily., Disp: 90 tablet, Rfl: 1   amLODipine-valsartan (EXFORGE) 5-160 MG tablet, Take 1 tablet by mouth daily., Disp: 90 tablet, Rfl: 1   Azelastine HCl 137 MCG/SPRAY SOLN, Place 2 sprays into the nose every morning., Disp: 30 mL, Rfl: 1   Cholecalciferol (VITAMIN D) 50 MCG (2000 UT) CAPS, Take 1 capsule by mouth daily at 12 noon., Disp: , Rfl:    fluticasone (FLONASE) 50 MCG/ACT nasal spray, Place 1 spray into both nostrils  every evening., Disp: 16 mL, Rfl: 2   pantoprazole (PROTONIX) 40 MG tablet, Take 1 tablet (40 mg total) by mouth daily., Disp: 90 tablet, Rfl: 1   amphetamine-dextroamphetamine (ADDERALL XR) 20 MG 24 hr capsule, Take 1 capsule (20 mg total) by mouth every morning., Disp: 30 capsule, Rfl: 0   amphetamine-dextroamphetamine (ADDERALL XR) 20 MG 24 hr capsule, Take 1 capsule (20 mg total) by mouth every morning., Disp: 30 capsule, Rfl: 0   amphetamine-dextroamphetamine (ADDERALL XR) 20 MG 24 hr capsule, Take 1 capsule (20 mg total) by mouth every morning., Disp: 30 capsule, Rfl: 0  No Known Allergies  I personally reviewed active problem list, medication list, allergies, family history, social history, health maintenance with the patient/caregiver  today.   ROS  Ten systems reviewed and is negative except as mentioned in HPI    Objective  Vitals:   04/30/23 1019 04/30/23 1026  BP: (!) 140/84 (!) 144/84  Pulse: 93   Resp: 14   Temp: 97.7 F (36.5 C)   TempSrc: Oral   SpO2: 99%   Weight: 191 lb (86.6 kg)   Height: 6\' 3"  (1.905 m)     Body mass index is 23.87 kg/m.  Physical Exam  Constitutional: Patient appears well-developed and well-nourished.  No distress.  HEENT: head atraumatic, normocephalic, pupils equal and reactive to light, neck supple Cardiovascular: Normal rate, regular rhythm and normal heart sounds.  No murmur heard. No BLE edema. Pulmonary/Chest: Effort normal and breath sounds normal. No respiratory distress. Abdominal: Soft.  There is no tenderness. Muscular skeletal: pain during palpation of left posterior shoulder, also some pain with rom  Psychiatric: Patient has a normal mood and affect. behavior is normal. Judgment and thought content normal.   PHQ2/9:    04/30/2023   10:21 AM 01/26/2023    9:40 AM 11/20/2022    9:53 AM 10/28/2022    1:37 PM 07/30/2022    1:33 PM  Depression screen PHQ 2/9  Decreased Interest 0 0 0 0 0  Down, Depressed, Hopeless 0 0 0 0 0  PHQ - 2 Score 0 0 0 0 0  Altered sleeping 0 0 0 0 0  Tired, decreased energy 0 0 0 0 0  Change in appetite 0 0 0 0 0  Feeling bad or failure about yourself  0 0 0 0 0  Trouble concentrating 0 0 0 0 0  Moving slowly or fidgety/restless 0 0 0 0 0  Suicidal thoughts 0 0 0 0 0  PHQ-9 Score 0 0 0 0 0    phq 9 is negative   Fall Risk:    04/30/2023   10:21 AM 01/26/2023    9:40 AM 11/20/2022    9:53 AM 10/28/2022    1:37 PM 07/30/2022    1:33 PM  Fall Risk   Falls in the past year? 0 0 0 0 0  Number falls in past yr:  0 0 0 0  Injury with Fall?  0 0 0 0  Risk for fall due to : No Fall Risks No Fall Risks No Fall Risks No Fall Risks No Fall Risks  Follow up Falls prevention discussed Falls prevention discussed Falls prevention  discussed Falls prevention discussed Falls prevention discussed     Assessment & Plan  1. Attention deficit hyperactivity disorder (ADHD), combined type  - amphetamine-dextroamphetamine (ADDERALL XR) 20 MG 24 hr capsule; Take 1 capsule (20 mg total) by mouth every morning.  Dispense: 30 capsule; Refill: 0 - amphetamine-dextroamphetamine (  ADDERALL XR) 20 MG 24 hr capsule; Take 1 capsule (20 mg total) by mouth every morning.  Dispense: 30 capsule; Refill: 0 - amphetamine-dextroamphetamine (ADDERALL XR) 20 MG 24 hr capsule; Take 1 capsule (20 mg total) by mouth every morning.  Dispense: 30 capsule; Refill: 0  2. Weight loss  - Thyroid Panel With TSH  3. Palpitation  - Thyroid Panel With TSH  4. Essential hypertension, benign  BP is high, he will return for BP check if still elevated we will add Toprol   5. Dyslipidemia  At goal   6. Thrombocytopenia (HCC)  We will continue to monitor   7. Controlled gout  Stable  8. Acute pain of left shoulder  Advised ice, avoid activities that causes it to flare, avoid nsaid's due to bp elevation

## 2023-04-30 ENCOUNTER — Ambulatory Visit: Payer: BC Managed Care – PPO | Admitting: Family Medicine

## 2023-04-30 ENCOUNTER — Encounter: Payer: Self-pay | Admitting: Family Medicine

## 2023-04-30 VITALS — BP 144/84 | HR 93 | Temp 97.7°F | Resp 14 | Ht 75.0 in | Wt 191.0 lb

## 2023-04-30 DIAGNOSIS — M109 Gout, unspecified: Secondary | ICD-10-CM

## 2023-04-30 DIAGNOSIS — E785 Hyperlipidemia, unspecified: Secondary | ICD-10-CM

## 2023-04-30 DIAGNOSIS — F902 Attention-deficit hyperactivity disorder, combined type: Secondary | ICD-10-CM

## 2023-04-30 DIAGNOSIS — M25512 Pain in left shoulder: Secondary | ICD-10-CM

## 2023-04-30 DIAGNOSIS — R002 Palpitations: Secondary | ICD-10-CM | POA: Diagnosis not present

## 2023-04-30 DIAGNOSIS — R634 Abnormal weight loss: Secondary | ICD-10-CM | POA: Diagnosis not present

## 2023-04-30 DIAGNOSIS — I10 Essential (primary) hypertension: Secondary | ICD-10-CM | POA: Diagnosis not present

## 2023-04-30 DIAGNOSIS — D696 Thrombocytopenia, unspecified: Secondary | ICD-10-CM

## 2023-04-30 MED ORDER — AMPHETAMINE-DEXTROAMPHET ER 20 MG PO CP24
20.0000 mg | ORAL_CAPSULE | ORAL | 0 refills | Status: DC
Start: 2023-04-30 — End: 2023-08-04

## 2023-05-01 LAB — THYROID PANEL WITH TSH
Free Thyroxine Index: 2.1 (ref 1.4–3.8)
T3 Uptake: 33 % (ref 22–35)
T4, Total: 6.5 ug/dL (ref 4.9–10.5)
TSH: 0.68 m[IU]/L (ref 0.40–4.50)

## 2023-05-07 ENCOUNTER — Ambulatory Visit: Payer: BC Managed Care – PPO

## 2023-05-07 VITALS — BP 136/84

## 2023-05-07 DIAGNOSIS — I10 Essential (primary) hypertension: Secondary | ICD-10-CM

## 2023-05-07 NOTE — Progress Notes (Signed)
Patient here for Bp check  Per last visit Dr. Carlynn Purl:   BP is high, he will return for BP check if still elevated we will add Toprol  Bp today is 136/84, a little lower than it was before.  Please advise

## 2023-05-26 ENCOUNTER — Other Ambulatory Visit: Payer: Self-pay

## 2023-05-26 ENCOUNTER — Telehealth: Payer: Self-pay

## 2023-05-26 DIAGNOSIS — Z1211 Encounter for screening for malignant neoplasm of colon: Secondary | ICD-10-CM

## 2023-05-26 MED ORDER — NA SULFATE-K SULFATE-MG SULF 17.5-3.13-1.6 GM/177ML PO SOLN: 1.0000 | Freq: Once | ORAL | 0 refills | Status: AC

## 2023-05-26 NOTE — Telephone Encounter (Signed)
Gastroenterology Pre-Procedure Review  Request Date: 06/25/23 Requesting Physician: Dr. Servando Snare  PATIENT REVIEW QUESTIONS: The patient responded to the following health history questions as indicated:    1. Are you having any GI issues? no 2. Do you have a personal history of Polyps? no 3. Do you have a family history of Colon Cancer or Polyps? yes (mother, father and brother colon polyps) 4. Diabetes Mellitus? no 5. Joint replacements in the past 12 months?no 6. Major health problems in the past 3 months?no 7. Any artificial heart valves, MVP, or defibrillator?no    MEDICATIONS & ALLERGIES:    Patient reports the following regarding taking any anticoagulation/antiplatelet therapy:   Plavix, Coumadin, Eliquis, Xarelto, Lovenox, Pradaxa, Brilinta, or Effient? no Aspirin? no  Patient confirms/reports the following medications:  Current Outpatient Medications  Medication Sig Dispense Refill   allopurinol (ZYLOPRIM) 100 MG tablet Take 1 tablet (100 mg total) by mouth daily. 90 tablet 1   amLODipine-valsartan (EXFORGE) 5-160 MG tablet Take 1 tablet by mouth daily. 90 tablet 1   amphetamine-dextroamphetamine (ADDERALL XR) 20 MG 24 hr capsule Take 1 capsule (20 mg total) by mouth every morning. 30 capsule 0   amphetamine-dextroamphetamine (ADDERALL XR) 20 MG 24 hr capsule Take 1 capsule (20 mg total) by mouth every morning. 30 capsule 0   amphetamine-dextroamphetamine (ADDERALL XR) 20 MG 24 hr capsule Take 1 capsule (20 mg total) by mouth every morning. 30 capsule 0   Azelastine HCl 137 MCG/SPRAY SOLN Place 2 sprays into the nose every morning. 30 mL 1   Cholecalciferol (VITAMIN D) 50 MCG (2000 UT) CAPS Take 1 capsule by mouth daily at 12 noon.     fluticasone (FLONASE) 50 MCG/ACT nasal spray Place 1 spray into both nostrils every evening. 16 mL 2   pantoprazole (PROTONIX) 40 MG tablet Take 1 tablet (40 mg total) by mouth daily. 90 tablet 1   No current facility-administered medications for  this visit.    Patient confirms/reports the following allergies:  No Known Allergies  No orders of the defined types were placed in this encounter.   AUTHORIZATION INFORMATION Primary Insurance: 1D#: Group #:  Secondary Insurance: 1D#: Group #:  SCHEDULE INFORMATION: Date: 06/25/23 Time: Location: [facility]

## 2023-05-26 NOTE — Telephone Encounter (Signed)
Pt left a vm that he was calling back to schedule his screening colonoscopy. Please contact pt to schedule.

## 2023-06-08 ENCOUNTER — Other Ambulatory Visit: Payer: Self-pay | Admitting: Family Medicine

## 2023-06-08 DIAGNOSIS — F902 Attention-deficit hyperactivity disorder, combined type: Secondary | ICD-10-CM

## 2023-06-08 NOTE — Telephone Encounter (Signed)
Unable to refuse 

## 2023-06-24 ENCOUNTER — Encounter: Payer: Self-pay | Admitting: Gastroenterology

## 2023-06-25 ENCOUNTER — Ambulatory Visit: Payer: BC Managed Care – PPO | Admitting: Anesthesiology

## 2023-06-25 ENCOUNTER — Encounter: Payer: Self-pay | Admitting: Gastroenterology

## 2023-06-25 ENCOUNTER — Other Ambulatory Visit: Payer: Self-pay

## 2023-06-25 ENCOUNTER — Ambulatory Visit
Admission: RE | Admit: 2023-06-25 | Discharge: 2023-06-25 | Disposition: A | Discharge status: Home / Self Care | Payer: BC Managed Care – PPO | Attending: Gastroenterology | Admitting: Gastroenterology

## 2023-06-25 ENCOUNTER — Encounter
Admission: RE | Disposition: A | Discharge status: Home / Self Care | Payer: Self-pay | Source: Home / Self Care | Attending: Gastroenterology

## 2023-06-25 DIAGNOSIS — K640 First degree hemorrhoids: Secondary | ICD-10-CM | POA: Diagnosis not present

## 2023-06-25 DIAGNOSIS — Z1211 Encounter for screening for malignant neoplasm of colon: Secondary | ICD-10-CM | POA: Diagnosis present

## 2023-06-25 DIAGNOSIS — I10 Essential (primary) hypertension: Secondary | ICD-10-CM | POA: Diagnosis not present

## 2023-06-25 DIAGNOSIS — K5730 Diverticulosis of large intestine without perforation or abscess without bleeding: Secondary | ICD-10-CM | POA: Insufficient documentation

## 2023-06-25 DIAGNOSIS — D125 Benign neoplasm of sigmoid colon: Secondary | ICD-10-CM | POA: Diagnosis not present

## 2023-06-25 DIAGNOSIS — K219 Gastro-esophageal reflux disease without esophagitis: Secondary | ICD-10-CM | POA: Insufficient documentation

## 2023-06-25 DIAGNOSIS — K635 Polyp of colon: Secondary | ICD-10-CM

## 2023-06-25 DIAGNOSIS — Z8249 Family history of ischemic heart disease and other diseases of the circulatory system: Secondary | ICD-10-CM | POA: Diagnosis not present

## 2023-06-25 DIAGNOSIS — Z87891 Personal history of nicotine dependence: Secondary | ICD-10-CM | POA: Diagnosis not present

## 2023-06-25 HISTORY — PX: COLONOSCOPY WITH PROPOFOL: SHX5780

## 2023-06-25 HISTORY — PX: POLYPECTOMY: SHX5525

## 2023-06-25 HISTORY — DX: Thrombocytopenia, unspecified: D69.6

## 2023-06-25 SURGERY — COLONOSCOPY WITH PROPOFOL
Anesthesia: General

## 2023-06-25 MED ORDER — PROPOFOL 10 MG/ML IV BOLUS
INTRAVENOUS | Status: DC | PRN
  Administered 2023-06-25: 200 mg via INTRAVENOUS
  Administered 2023-06-25: 140 ug/kg/min via INTRAVENOUS

## 2023-06-25 MED ORDER — LIDOCAINE HCL (CARDIAC) PF 100 MG/5ML IV SOSY
PREFILLED_SYRINGE | INTRAVENOUS | Status: DC | PRN
  Administered 2023-06-25: 100 mg via INTRAVENOUS

## 2023-06-25 MED ORDER — SODIUM CHLORIDE 0.9 % IV SOLN: INTRAVENOUS | Status: DC

## 2023-06-25 MED ORDER — PROPOFOL 500 MG/50ML IV EMUL: INTRAVENOUS | Status: DC | PRN

## 2023-06-25 NOTE — Transfer of Care (Signed)
 Immediate Anesthesia Transfer of Care Note  Patient: Victor Jackson  Procedure(s) Performed: COLONOSCOPY WITH PROPOFOL  POLYPECTOMY  Patient Location: PACU  Anesthesia Type:MAC  Level of Consciousness: awake, alert , and oriented  Airway & Oxygen Therapy: Patient Spontanous Breathing  Post-op Assessment: Report given to RN and Post -op Vital signs reviewed and stable  Post vital signs: stable  Last Vitals:  Vitals Value Taken Time  BP 119/82 06/25/23 1120  Temp 97   Pulse 79 06/25/23 1120  Resp 25 06/25/23 1120  SpO2 100 % 06/25/23 1120  Vitals shown include unfiled device data.  Last Pain:  Vitals:   06/25/23 1022  TempSrc: Temporal  PainSc: 0-No pain         Complications: No notable events documented.

## 2023-06-25 NOTE — Anesthesia Preprocedure Evaluation (Signed)
 Anesthesia Evaluation  Patient identified by MRN, date of birth, ID band Patient awake    Reviewed: Allergy & Precautions, NPO status , Patient's Chart, lab work & pertinent test results  History of Anesthesia Complications Negative for: history of anesthetic complications  Airway Mallampati: III  TM Distance: >3 FB Neck ROM: full    Dental  (+) Chipped, Poor Dentition   Pulmonary neg shortness of breath, former smoker   Pulmonary exam normal        Cardiovascular Exercise Tolerance: Good hypertension, (-) angina Normal cardiovascular exam     Neuro/Psych  PSYCHIATRIC DISORDERS      negative neurological ROS     GI/Hepatic Neg liver ROS,GERD  ,,  Endo/Other  negative endocrine ROS    Renal/GU negative Renal ROS  negative genitourinary   Musculoskeletal   Abdominal   Peds  Hematology negative hematology ROS (+)   Anesthesia Other Findings Past Medical History: No date: ADD (attention deficit disorder) No date: GERD (gastroesophageal reflux disease) No date: Hypertension No date: Thrombocytopenia (HCC) No date: Vitamin D  deficiency  Past Surgical History: 1998: KNEE ARTHROSCOPY; Left No date: none  BMI    Body Mass Index: 22.75 kg/m      Reproductive/Obstetrics negative OB ROS                             Anesthesia Physical Anesthesia Plan  ASA: 2  Anesthesia Plan: General   Post-op Pain Management:    Induction: Intravenous  PONV Risk Score and Plan: Propofol  infusion and TIVA  Airway Management Planned: Natural Airway and Nasal Cannula  Additional Equipment:   Intra-op Plan:   Post-operative Plan:   Informed Consent: I have reviewed the patients History and Physical, chart, labs and discussed the procedure including the risks, benefits and alternatives for the proposed anesthesia with the patient or authorized representative who has indicated his/her  understanding and acceptance.     Dental Advisory Given  Plan Discussed with: Anesthesiologist, CRNA and Surgeon  Anesthesia Plan Comments: (Patient consented for risks of anesthesia including but not limited to:  - adverse reactions to medications - risk of airway placement if required - damage to eyes, teeth, lips or other oral mucosa - nerve damage due to positioning  - sore throat or hoarseness - Damage to heart, brain, nerves, lungs, other parts of body or loss of life  Patient voiced understanding and assent.)       Anesthesia Quick Evaluation

## 2023-06-25 NOTE — Op Note (Signed)
 [facility] Gastroenterology Patient Name: Victor Jackson Procedure Date: 06/25/2023 10:38 AM MRN: [id number] Account #: [id number] Date of Birth: [birth year] Admit Type: Outpatient Age: 48 Room: [facility] ENDO ROOM 4 Gender: Male Note Status: Finalized Instrument Name: Arvis [id number] Procedure:             Colonoscopy Indications:           Screening for colorectal malignant neoplasm Providers:             Rogelia Copping MD, MD Referring MD:          Dorette FALCON. Sowles, MD (Referring MD) Medicines:             Propofol  per Anesthesia Complications:         No immediate complications. Procedure:             Pre-Anesthesia Assessment:                        - Prior to the procedure, a History and Physical was                         performed, and patient medications and allergies were                         reviewed. The patient's tolerance of previous                         anesthesia was also reviewed. The risks and benefits                         of the procedure and the sedation options and risks                         were discussed with the patient. All questions were                         answered, and informed consent was obtained. Prior                         Anticoagulants: The patient has taken no anticoagulant                         or antiplatelet agents. ASA Grade Assessment: II - A                         patient with mild systemic disease. After reviewing                         the risks and benefits, the patient was deemed in                         satisfactory condition to undergo the procedure.                        After obtaining informed consent, the colonoscope was                         passed under direct vision. Throughout the procedure,  the patient's blood pressure, pulse, and oxygen                         saturations were monitored continuously. The                         Colonoscope was introduced through the  anus and                         advanced to the the cecum, identified by appendiceal                         orifice and ileocecal valve. The colonoscopy was                         performed without difficulty. The patient tolerated                         the procedure well. The quality of the bowel                         preparation was excellent. Findings:      The perianal and digital rectal examinations were normal.      A 3 mm polyp was found in the hepatic flexure. The polyp was sessile.       The polyp was removed with a cold snare. Resection and retrieval were       complete.      A 3 mm polyp was found in the sigmoid colon. The polyp was sessile. The       polyp was removed with a cold snare. Resection and retrieval were       complete.      Multiple small-mouthed diverticula were found in the entire colon.      Non-bleeding internal hemorrhoids were found during retroflexion. The       hemorrhoids were Grade I (internal hemorrhoids that do not prolapse). Impression:            - One 3 mm polyp at the hepatic flexure, removed with                         a cold snare. Resected and retrieved.                        - One 3 mm polyp in the sigmoid colon, removed with a                         cold snare. Resected and retrieved.                        - Diverticulosis in the entire examined colon.                        - Non-bleeding internal hemorrhoids. Recommendation:        - Discharge patient to home.                        - Resume previous diet.                        -  Continue present medications.                        - Await pathology results.                        - If the pathology report reveals adenomatous tissue,                         then repeat the colonoscopy for surveillance in 7                         years. Procedure Code(s):     --- Professional ---                        [phone], Colonoscopy, flexible; with removal of                          tumor(s), polyp(s), or other lesion(s) by snare                         technique Diagnosis Code(s):     --- Professional ---                        Z12.11, Encounter for screening for malignant neoplasm                         of colon                        D12.5, Benign neoplasm of sigmoid colon CPT copyright 2022 American Medical Association. All rights reserved. The codes documented in this report are preliminary and upon coder review may  be revised to meet current compliance requirements. Rogelia Copping MD, MD 06/25/2023 11:16:46 AM This report has been signed electronically. Number of Addenda: 0 Note Initiated On: 06/25/2023 10:38 AM Scope Withdrawal Time: 0 hours 7 minutes 4 seconds  Total Procedure Duration: 0 hours 12 minutes 4 seconds  Estimated Blood Loss:  Estimated blood loss: none.      [facility]

## 2023-06-25 NOTE — Anesthesia Postprocedure Evaluation (Signed)
 Anesthesia Post Note  Patient: Victor Jackson  Procedure(s) Performed: COLONOSCOPY WITH PROPOFOL  POLYPECTOMY  Patient location during evaluation: Endoscopy Anesthesia Type: General Level of consciousness: awake and alert Pain management: pain level controlled Vital Signs Assessment: post-procedure vital signs reviewed and stable Respiratory status: spontaneous breathing, nonlabored ventilation, respiratory function stable and patient connected to nasal cannula oxygen Cardiovascular status: blood pressure returned to baseline and stable Postop Assessment: no apparent nausea or vomiting Anesthetic complications: no   No notable events documented.   Last Vitals:  Vitals:   06/25/23 1022 06/25/23 1119  BP: (!) 149/100 119/82  Pulse: 87   Resp: 16   Temp: (!) 36.3 C (!) 36.1 C  SpO2: 99%     Last Pain:  Vitals:   06/25/23 1119  TempSrc: Temporal  PainSc: 0-No pain                 Fairy POUR Sanav Remer

## 2023-06-25 NOTE — H&P (Signed)
 Rogelia Copping, MD [facility] [address] [place], [place] [address] Phone: [phone] Fax : [phone]  Primary Care Physician:  Glenard Mire, MD Primary Gastroenterologist:  Dr. Copping  Pre-Procedure History & Physical: HPI:  Victor Jackson is a 48 y.o. male is here for a screening colonoscopy.   Past Medical History:  Diagnosis Date   ADD (attention deficit disorder)    GERD (gastroesophageal reflux disease)    Hypertension    Thrombocytopenia (HCC)    Vitamin D  deficiency     Past Surgical History:  Procedure Laterality Date   KNEE ARTHROSCOPY Left 1998   none      Prior to Admission medications   Medication Sig Start Date End Date Taking? Authorizing Provider  allopurinol  (ZYLOPRIM ) 100 MG tablet Take 1 tablet (100 mg total) by mouth daily. 12/31/22  Yes Sowles, Krichna, MD  amLODipine -valsartan  (EXFORGE ) 5-160 MG tablet Take 1 tablet by mouth daily. 01/26/23  Yes Sowles, Krichna, MD  amphetamine -dextroamphetamine (ADDERALL XR) 20 MG 24 hr capsule Take 1 capsule (20 mg total) by mouth every morning. 04/30/23  Yes Sowles, Krichna, MD  amphetamine -dextroamphetamine (ADDERALL XR) 20 MG 24 hr capsule Take 1 capsule (20 mg total) by mouth every morning. 04/30/23  Yes Sowles, Krichna, MD  amphetamine -dextroamphetamine (ADDERALL XR) 20 MG 24 hr capsule Take 1 capsule (20 mg total) by mouth every morning. 04/30/23  Yes Sowles, Krichna, MD  Cholecalciferol (VITAMIN D ) 50 MCG (2000 UT) CAPS Take 1 capsule by mouth daily at 12 noon.   Yes [provider]  pantoprazole  (PROTONIX ) 40 MG tablet Take 1 tablet (40 mg total) by mouth daily. 01/26/23  Yes Sowles, Krichna, MD  Azelastine  HCl 137 MCG/SPRAY SOLN Place 2 sprays into the nose every morning. 01/26/23   Sowles, Krichna, MD  fluticasone  (FLONASE ) 50 MCG/ACT nasal spray Place 1 spray into both nostrils every evening. 01/26/23   Sowles, Krichna, MD    Allergies as of 05/26/2023   (No Known Allergies)    Family  History  Problem Relation Age of Onset   Arthritis Mother    Diabetes Mother    Hypertension Mother    COPD Father    Hypertension Father     Social History   Socioeconomic History   Marital status: Married    Spouse name: Sierra   Number of children: Not on file   Years of education: Not on file   Highest education level: Not on file  Occupational History   Not on file  Tobacco Use   Smoking status: Former    Current packs/day: 0.00    Types: Cigars, Cigarettes    Quit date: 06/28/2013    Years since quitting: 9.9   Smokeless tobacco: Never  Vaping Use   Vaping status: Never Used  Substance and Sexual Activity   Alcohol use: Yes    Alcohol/week: 1.0 standard drink of alcohol    Types: 1 Cans of beer per week    Comment: rarely   Drug use: No   Sexual activity: Yes    Partners: Female    Birth control/protection: Other-see comments    Comment: girlfriend had tubal ligation   Other Topics Concern   Not on file  Social History Narrative   Lives with girlfriend ETTER Friday ) and their 3 children.    Working at [employer], heritage manager.    Social Drivers of Health   Financial Resource Strain: Low Risk  (11/20/2022)   Overall Financial Resource Strain (CARDIA)  Difficulty of Paying Living Expenses: Not hard at all  Food Insecurity: No Food Insecurity (11/20/2022)   Hunger Vital Sign    Worried About Running Out of Food in the Last Year: Never true    Ran Out of Food in the Last Year: Never true  Transportation Needs: No Transportation Needs (11/20/2022)   PRAPARE - Administrator, Civil Service (Medical): No    Lack of Transportation (Non-Medical): No  Physical Activity: Sufficiently Active (11/20/2022)   Exercise Vital Sign    Days of Exercise per Week: 5 days    Minutes of Exercise per Session: 90 min  Stress: No Stress Concern Present (11/20/2022)   [employer] of Occupational Health - Occupational Stress Questionnaire    Feeling of  Stress : Not at all  Social Connections: Socially Integrated (11/20/2022)   Social Connection and Isolation Panel [NHANES]    Frequency of Communication with Friends and Family: More than three times a week    Frequency of Social Gatherings with Friends and Family: Once a week    Attends Religious Services: More than 4 times per year    Active Member of [employer] or Organizations: Yes    Attends Engineer, Structural: More than 4 times per year    Marital Status: Married  Catering Manager Violence: Not At Risk (11/20/2022)   Humiliation, Afraid, Rape, and Kick questionnaire    Fear of Current or Ex-Partner: No    Emotionally Abused: No    Physically Abused: No    Sexually Abused: No    Review of Systems: See HPI, otherwise negative ROS  Physical Exam: BP (!) 149/100   Pulse 87   Temp (!) 97.4 F (36.3 C) (Temporal)   Resp 16   Ht 6' 3 (1.905 m)   Wt 82.6 kg   SpO2 99%   BMI 22.75 kg/m  General:   Alert,  pleasant and cooperative in NAD Head:  Normocephalic and atraumatic. Neck:  Supple; no masses or thyromegaly. Lungs:  Clear throughout to auscultation.    Heart:  Regular rate and rhythm. Abdomen:  Soft, nontender and nondistended. Normal bowel sounds, without guarding, and without rebound.   Neurologic:  Alert and  oriented x4;  grossly normal neurologically.  Impression/Plan: Victor Jackson is now here to undergo a screening colonoscopy.  Risks, benefits, and alternatives regarding colonoscopy have been reviewed with the patient.  Questions have been answered.  All parties agreeable.

## 2023-06-26 ENCOUNTER — Encounter: Payer: Self-pay | Admitting: Gastroenterology

## 2023-06-26 LAB — SURGICAL PATHOLOGY

## 2023-07-03 ENCOUNTER — Other Ambulatory Visit: Payer: Self-pay | Admitting: Family Medicine

## 2023-07-03 DIAGNOSIS — E790 Hyperuricemia without signs of inflammatory arthritis and tophaceous disease: Secondary | ICD-10-CM

## 2023-07-06 ENCOUNTER — Encounter: Payer: Self-pay | Admitting: Family Medicine

## 2023-07-06 ENCOUNTER — Telehealth: Payer: Self-pay

## 2023-07-06 ENCOUNTER — Other Ambulatory Visit: Payer: Self-pay | Admitting: Family Medicine

## 2023-07-06 DIAGNOSIS — E790 Hyperuricemia without signs of inflammatory arthritis and tophaceous disease: Secondary | ICD-10-CM

## 2023-07-06 MED ORDER — ALLOPURINOL 100 MG PO TABS: 100.0000 mg | ORAL_TABLET | Freq: Every day | ORAL | 1 refills | Status: DC

## 2023-07-06 NOTE — Telephone Encounter (Signed)
Last follow up 04/30/23

## 2023-07-06 NOTE — Telephone Encounter (Signed)
Requested medication (s) are due for refill today: yes  Requested medication (s) are on the active medication list: yes  Last refill:  12/31/22 #90 1 RF  Future visit scheduled: no  Notes to clinic:  overdue lab work   Requested Prescriptions  Pending Prescriptions Disp Refills   allopurinol (ZYLOPRIM) 100 MG tablet [Pharmacy Med Name: Allopurinol 100 MG Oral Tablet] 90 tablet 0    Sig: Take 1 tablet by mouth once daily     Endocrinology:  Gout Agents - allopurinol Failed - 07/06/2023  8:35 AM      Failed - Uric Acid in normal range and within 360 days    Uric Acid, Serum  Date Value Ref Range Status  07/11/2021 5.4 4.0 - 8.0 mg/dL Final    Comment:    Therapeutic target for gout patients: <6.0 mg/dL .          Failed - Cr in normal range and within 360 days    Creat  Date Value Ref Range Status  11/20/2022 1.30 (H) 0.60 - 1.29 mg/dL Final   Creatinine, Urine  Date Value Ref Range Status  04/01/2019 140 20 - 320 mg/dL Final         Passed - Valid encounter within last 12 months    Recent Outpatient Visits           2 months ago Weight loss   [facility] Alba Cory, MD   5 months ago Thrombocytopenia [facility])   [facility] Alba Cory, MD   7 months ago Well adult exam   [facility] Alba Cory, MD   8 months ago Gastroesophageal reflux disease without esophagitis   [facility] Alba Cory, MD   11 months ago Pre-diabetes   [facility] Alba Cory, MD       Future Appointments             In 4 weeks Alba Cory, MD [facility]            Passed - CBC within normal limits and completed in the last 12 months    WBC  Date Value Ref Range Status  11/20/2022 5.0 3.8 - 10.8 Thousand/uL Final   RBC  Date Value Ref Range Status  11/20/2022 4.79 4.20 - 5.80 Million/uL  Final   Hemoglobin  Date Value Ref Range Status  11/20/2022 14.8 13.2 - 17.1 g/dL Final   HCT  Date Value Ref Range Status  11/20/2022 43.8 38.5 - 50.0 % Final   MCHC  Date Value Ref Range Status  11/20/2022 33.8 32.0 - 36.0 g/dL Final   [facility]  Date Value Ref Range Status  11/20/2022 30.9 27.0 - 33.0 pg Final   MCV  Date Value Ref Range Status  11/20/2022 91.4 80.0 - 100.0 fL Final   No results found for: "PLTCOUNTKUC", "LABPLAT", "POCPLA" RDW  Date Value Ref Range Status  11/20/2022 12.2 11.0 - 15.0 % Final

## 2023-07-06 NOTE — Telephone Encounter (Signed)
Error

## 2023-07-28 ENCOUNTER — Other Ambulatory Visit: Payer: Self-pay | Admitting: Family Medicine

## 2023-07-28 DIAGNOSIS — K219 Gastro-esophageal reflux disease without esophagitis: Secondary | ICD-10-CM

## 2023-08-04 ENCOUNTER — Encounter: Payer: Self-pay | Admitting: Family Medicine

## 2023-08-04 ENCOUNTER — Ambulatory Visit: Payer: BC Managed Care – PPO | Admitting: Family Medicine

## 2023-08-04 VITALS — BP 142/80 | HR 96 | Resp 16 | Ht 75.0 in | Wt 182.4 lb

## 2023-08-04 DIAGNOSIS — E785 Hyperlipidemia, unspecified: Secondary | ICD-10-CM

## 2023-08-04 DIAGNOSIS — R7303 Prediabetes: Secondary | ICD-10-CM

## 2023-08-04 DIAGNOSIS — E559 Vitamin D deficiency, unspecified: Secondary | ICD-10-CM

## 2023-08-04 DIAGNOSIS — R634 Abnormal weight loss: Secondary | ICD-10-CM

## 2023-08-04 DIAGNOSIS — F902 Attention-deficit hyperactivity disorder, combined type: Secondary | ICD-10-CM

## 2023-08-04 DIAGNOSIS — I10 Essential (primary) hypertension: Secondary | ICD-10-CM

## 2023-08-04 DIAGNOSIS — D696 Thrombocytopenia, unspecified: Secondary | ICD-10-CM

## 2023-08-04 DIAGNOSIS — K219 Gastro-esophageal reflux disease without esophagitis: Secondary | ICD-10-CM

## 2023-08-04 DIAGNOSIS — M109 Gout, unspecified: Secondary | ICD-10-CM

## 2023-08-04 MED ORDER — AMPHETAMINE-DEXTROAMPHET ER 20 MG PO CP24: 20.0000 mg | ORAL_CAPSULE | ORAL | 0 refills | Status: DC

## 2023-08-04 MED ORDER — AMLODIPINE BESYLATE-VALSARTAN 5-320 MG PO TABS: 1.0000 | ORAL_TABLET | Freq: Every day | ORAL | 0 refills | Status: DC

## 2023-08-04 NOTE — Progress Notes (Signed)
Name: HAZE ANTILLON   MRN: [id number]    DOB: 18-Sep-1975   Date:08/04/2023       Progress Note  Subjective  Chief Complaint  Chief Complaint  Patient presents with   Medical Management of Chronic Issues   HPI   HTN: He is now taking Exforge and tolerating it well, bp has been elevated over the past 2 visits, he is not checking bp at home. He has been losing weight but normal TSH, colonoscopy up to date, no chest pain, palpitation, he was having some anxiety but that has resolved. We will adjust dose of exforge to 5/320 mg daily and monitor    GERD: he is taking Pantoprazole and symptoms are controlled again, denies heartburn or indigestion .Skips medication occasionally , he just got a refill    ADHD: son also has ADHD, he was diagnosed at the time that his son was diagnosed with this condition about 14  years ago. He states medication helps him focus at work, he used to take Adderal 15 mg XR and stopped working, so  we switched to Vyvanse and he did not like the medication - it made him feel weird, so he asked to go back to Adderal XR , he is currently on 20 mg XR . BP is going up but not sure if related to medication since on the same dose for a long time.    Gout: no recent episodes , he takes allopurinol daily  and denies side effects, , last uric acid was at goal .   Pre diabetes:  last abnormal A1C was a few years ago, he is doing well, losing weight by choice, avoiding snacks throughout the day , he states also more active at work    Thrombocytopenia :level is stable now, improving, reviewed with patient, denies easy bleeding .We will recheck it yearly    Elevated ANA and knee pain: seen by Dr. Allena Katz and advised topical medication, no synovitis . Family history of RA - mother but denies any symptoms at this time   Patient Active Problem List   Diagnosis Date Noted   Encounter for screening colonoscopy 06/25/2023   Polyp of sigmoid colon 06/25/2023   Pre-diabetes 08/19/2019    Dyslipidemia 08/19/2019   Essential hypertension, benign 08/19/2019   Pes planus 09/11/2016   Controlled gout 05/02/2016   Umbilical hernia 11/27/2015   GERD (gastroesophageal reflux disease) 11/27/2015   Thrombocytopenia (HCC) 11/27/2015   Vitamin D deficiency 11/27/2015   Hypertension, benign 11/27/2015   ADD (attention deficit disorder) 11/27/2015   History of hand fracture     Past Surgical History:  Procedure Laterality Date   COLONOSCOPY WITH PROPOFOL N/A 06/25/2023   Procedure: COLONOSCOPY WITH PROPOFOL;  Surgeon: Midge Minium, MD;  Location: [facility] ENDOSCOPY;  Service: Endoscopy;  Laterality: N/A;   KNEE ARTHROSCOPY Left 1998   none     POLYPECTOMY  06/25/2023   Procedure: POLYPECTOMY;  Surgeon: Midge Minium, MD;  Location: [facility] ENDOSCOPY;  Service: Endoscopy;;    Family History  Problem Relation Age of Onset   Arthritis Mother    Diabetes Mother    Hypertension Mother    COPD Father    Hypertension Father     Social History   Tobacco Use   Smoking status: Former    Current packs/day: 0.00    Types: Cigars, Cigarettes    Quit date: 06/28/2013    Years since quitting: 10.1   Smokeless tobacco: Never  Substance Use Topics   Alcohol  use: Yes    Alcohol/week: 1.0 standard drink of alcohol    Types: 1 Cans of beer per week    Comment: rarely     Current Outpatient Medications:    allopurinol (ZYLOPRIM) 100 MG tablet, Take 1 tablet (100 mg total) by mouth daily., Disp: 90 tablet, Rfl: 1   amLODipine-valsartan (EXFORGE) 5-160 MG tablet, Take 1 tablet by mouth daily., Disp: 90 tablet, Rfl: 1   amphetamine-dextroamphetamine (ADDERALL XR) 20 MG 24 hr capsule, Take 1 capsule (20 mg total) by mouth every morning., Disp: 30 capsule, Rfl: 0   amphetamine-dextroamphetamine (ADDERALL XR) 20 MG 24 hr capsule, Take 1 capsule (20 mg total) by mouth every morning., Disp: 30 capsule, Rfl: 0   amphetamine-dextroamphetamine (ADDERALL XR) 20 MG 24 hr capsule, Take 1 capsule (20 mg  total) by mouth every morning., Disp: 30 capsule, Rfl: 0   Azelastine HCl 137 MCG/SPRAY SOLN, Place 2 sprays into the nose every morning., Disp: 30 mL, Rfl: 1   Cholecalciferol (VITAMIN D) 50 MCG (2000 UT) CAPS, Take 1 capsule by mouth daily at 12 noon., Disp: , Rfl:    fluticasone (FLONASE) 50 MCG/ACT nasal spray, Place 1 spray into both nostrils every evening., Disp: 16 mL, Rfl: 2   pantoprazole (PROTONIX) 40 MG tablet, Take 1 tablet by mouth once daily, Disp: 90 tablet, Rfl: 0  No Known Allergies  I personally reviewed active problem list, medication list, allergies, family history with the patient/caregiver today.   ROS  Ten systems reviewed and is negative except as mentioned in HPI    Objective  Vitals:   08/04/23 1024 08/04/23 1035  BP: (!) 146/80 (!) 142/80  Pulse: 96   Resp: 16   SpO2: 99%   Weight: 182 lb 6.4 oz (82.7 kg)   Height: 6\' 3"  (1.905 m)     Body mass index is 22.8 kg/m.  Physical Exam  Constitutional: Patient appears well-developed and well-nourished.  No distress.  HEENT: head atraumatic, normocephalic, pupils equal and reactive to light,, neck supple Cardiovascular: Normal rate, regular rhythm and normal heart sounds.  No murmur heard. No BLE edema. Pulmonary/Chest: Effort normal and breath sounds normal. No respiratory distress. Abdominal: Soft.  There is no tenderness. Psychiatric: Patient has a normal mood and affect. behavior is normal. Judgment and thought content normal.   Recent Results (from the past 2160 hours)  Surgical pathology     Status: None   Collection Time: 06/25/23 12:00 AM  Result Value Ref Range   SURGICAL PATHOLOGY      SURGICAL PATHOLOGY [facility] [address], [address] [place], [place] [address] Telephone [phone] or [phone] Fax [phone]  REPORT OF SURGICAL PATHOLOGY   Accession #: [id number] Patient Name: DANDREA, WIDDOWSON Visit # : [id number]  MRN: [id number] Physician: Midge Minium DOB/Age [birth year] (Age: 65) Gender: M Collected Date: 06/25/2023 Received Date: 06/25/2023  FINAL DIAGNOSIS       1. Hepatic Flexure Polyp, cold snare :       - HYPERPLASTIC POLYP.      - NEGATIVE FOR DYSPLASIA AND MALIGNANCY.       2. Sigmoid  Colon Polyp, cold snare :       - TUBULAR ADENOMA.      - NEGATIVE FOR HIGH-GRADE DYSPLASIA AND MALIGNANCY.       ELECTRONIC SIGNATURE : Rubinas Md, Delice Bison , Sports administrator, Electronic Signature  MICROSCOPIC DESCRIPTION  CASE COMMENTS STAINS USED IN DIAGNOSIS: H&E H&E    CLINICAL HISTORY  SPECIMEN(S) OBTAINED 1. Hepatic Flexure Polyp, Cold Snare 2. Sigmoid  Colon Polyp, Cold Snare  SPECIMEN COMMENTS: SPE CIMEN CLINICAL INFORMATION: 1. screening colonscopy.Diverticulosis, colon polyp.    Gross Description 1. Received in formalin is a 1.0 x 1.0 x 0.1 cm aggregate of multiple tan polypoid tissue fragments submitted entirely in block 1A. 2. Received in formalin is a 0.6 x 0.5 x 0.2 cm aggregate of multiple polypoid tissue fragments, submitted entirely in block 2A.(SB:kh 06/25/23)        Report signed out from the following location(s) [place] [address], [place] [address] CLIA #: [id number]  [facility] [address] [place], [place] [address] CLIA #: [id number]     Diabetic Foot Exam:     PHQ2/9:    08/04/2023   10:19 AM 04/30/2023   10:21 AM 01/26/2023    9:40 AM 11/20/2022    9:53 AM 10/28/2022    1:37 PM  Depression screen PHQ 2/9  Decreased Interest 0 0 0 0 0  Down, Depressed, Hopeless 0 0 0 0 0  PHQ - 2 Score 0 0 0 0 0  Altered sleeping 0 0 0 0 0  Tired, decreased energy 0 0 0 0 0  Change in appetite 0 0 0 0 0  Feeling bad or failure about yourself  0 0 0 0 0  Trouble concentrating 0 0 0 0 0  Moving slowly or fidgety/restless 0 0 0 0 0  Suicidal thoughts 0 0 0 0 0  PHQ-9 Score 0 0 0 0 0  Difficult doing work/chores Not difficult at all         phq 9 is negative  Fall Risk:    08/04/2023   10:18 AM 04/30/2023   10:21 AM 01/26/2023    9:40 AM 11/20/2022    9:53 AM 10/28/2022    1:37 PM  Fall Risk   Falls in the past year? 0 0 0 0 0  Number falls in past yr: 0  0 0 0  Injury with Fall? 0  0 0 0  Risk for fall due to : No Fall Risks No Fall Risks No Fall Risks No Fall Risks No Fall Risks  Follow up Falls prevention discussed;Education provided;Falls evaluation completed Falls prevention discussed Falls prevention discussed Falls prevention discussed Falls prevention discussed     Assessment & Plan  1. Attention deficit hyperactivity disorder (ADHD), combined type (Primary)  - amphetamine-dextroamphetamine (ADDERALL XR) 20 MG 24 hr capsule; Take 1 capsule (20 mg total) by mouth every morning.  Dispense: 30 capsule; Refill: 0 - amphetamine-dextroamphetamine (ADDERALL XR) 20 MG 24 hr capsule; Take 1 capsule (20 mg total) by mouth every morning.  Dispense: 30 capsule; Refill: 0 - amphetamine-dextroamphetamine (ADDERALL XR) 20 MG 24 hr capsule; Take 1 capsule (20 mg total) by mouth every morning.  Dispense: 30 capsule; Refill: 0  2. Thrombocytopenia (HCC)  Recheck yearly  3. Essential hypertension, benign  - amLODipine-valsartan (EXFORGE) 5-320 MG tablet; Take 1 tablet by mouth daily.  Dispense: 90 tablet; Refill: 0  4. Dyslipidemia  On diet only  5. Controlled gout  Controlled   6. Vitamin D deficiency  Continue otc  7. Gastroesophageal reflux disease without esophagitis  Controlled   8. Pre-diabetes  Losing weight   9. Weight loss   He needs to increase protein intake, needs to exercise more. Monitor weight at home

## 2023-08-15 ENCOUNTER — Encounter: Payer: Self-pay | Admitting: Family Medicine

## 2023-08-18 ENCOUNTER — Ambulatory Visit: Admitting: Family Medicine

## 2023-08-18 ENCOUNTER — Encounter: Payer: Self-pay | Admitting: Family Medicine

## 2023-08-18 VITALS — BP 138/84 | HR 94 | Resp 16 | Ht 75.0 in | Wt 185.2 lb

## 2023-08-18 DIAGNOSIS — F439 Reaction to severe stress, unspecified: Secondary | ICD-10-CM | POA: Diagnosis not present

## 2023-08-18 DIAGNOSIS — F341 Dysthymic disorder: Secondary | ICD-10-CM

## 2023-08-18 DIAGNOSIS — F419 Anxiety disorder, unspecified: Secondary | ICD-10-CM | POA: Diagnosis not present

## 2023-08-18 MED ORDER — ESCITALOPRAM OXALATE 10 MG PO TABS: 10.0000 mg | ORAL_TABLET | Freq: Every day | ORAL | 1 refills | Status: DC

## 2023-08-18 NOTE — Progress Notes (Signed)
 Name: Victor Jackson   MRN: [id number]    DOB: 09-22-1975   Date:08/18/2023       Progress Note  Subjective  Chief Complaint  Chief Complaint  Patient presents with   Form Completion   Stress   Depression    Work and home, couple months, has missed work    Discussed the use of AI scribe software for clinical note transcription with the patient, who gave verbal consent to proceed.  History of Present Illness   Victor Jackson is a 48 year old male who presents for FMLA paperwork due to stress-related issues.  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 He is considering transferring to another store or department but has not found any openings yet.  At home, he and his wife are discussing separation, which has been ongoing for a couple of months. He attempted counseling, and he is currently in therapy with a counselor named Marcelino Duster through a virtual program offered by [employer]. He recently signed a lease for an apartment but faced financial difficulties when his wife withdrew money from his account, complicating his ability to move forward with the separation.  He has taken a leave from work since February 25th and plans to return on March 8th. He has difficulty sleeping, with his mind racing and an inability to focus, leading to a lack of appetite. No current suicidal ideation, but he admits to having a fleeting thought about whether 'it would be better if I wasn't here.' He reports symptoms consistent with depression, including lack of pleasure in activities, hopelessness, and low energy. No panic attacks, hallucinations, or impulse control issues. He is able to perform daily activities such as cooking, cleaning, and personal hygiene, although he feels unmotivated.  He is currently taking Adderall for ADHD but has not previously taken  medication for depression.        Patient Active Problem List   Diagnosis Date Noted   Encounter for screening colonoscopy 06/25/2023   Polyp of sigmoid colon 06/25/2023   Pre-diabetes 08/19/2019   Dyslipidemia 08/19/2019   Essential hypertension, benign 08/19/2019   Pes planus 09/11/2016   Controlled gout 05/02/2016   Umbilical hernia 11/27/2015   GERD (gastroesophageal reflux disease) 11/27/2015   Thrombocytopenia (HCC) 11/27/2015   Vitamin D deficiency 11/27/2015   Hypertension, benign 11/27/2015   ADD (attention deficit disorder) 11/27/2015   History of hand fracture     Social History   Tobacco Use   Smoking status: Former    Current packs/day: 0.00    Types: Cigars, Cigarettes    Quit date: 06/28/2013    Years since quitting: 10.1   Smokeless tobacco: Never  Substance Use Topics   Alcohol use: Yes    Alcohol/week: 1.0 standard drink of alcohol    Types: 1 Cans of beer per week    Comment: rarely     Current Outpatient Medications:    allopurinol (ZYLOPRIM) 100 MG tablet, Take 1 tablet (100 mg total) by mouth daily., Disp: 90 tablet, Rfl: 1   amLODipine-valsartan (EXFORGE) 5-320 MG tablet, Take 1 tablet by mouth daily., Disp: 90 tablet, Rfl: 0   amphetamine-dextroamphetamine (ADDERALL XR) 20 MG 24 hr capsule, Take 1 capsule (20 mg total) by mouth every morning., Disp: 30 capsule, Rfl: 0   amphetamine-dextroamphetamine (ADDERALL XR) 20 MG 24 hr capsule, Take 1 capsule (20 mg total)  by mouth every morning., Disp: 30 capsule, Rfl: 0   amphetamine-dextroamphetamine (ADDERALL XR) 20 MG 24 hr capsule, Take 1 capsule (20 mg total) by mouth every morning., Disp: 30 capsule, Rfl: 0   Azelastine HCl 137 MCG/SPRAY SOLN, Place 2 sprays into the nose every morning., Disp: 30 mL, Rfl: 1   Cholecalciferol (VITAMIN D) 50 MCG (2000 UT) CAPS, Take 1 capsule by mouth daily at 12 noon., Disp: , Rfl:    fluticasone (FLONASE) 50 MCG/ACT nasal spray, Place 1 spray into both nostrils every  evening., Disp: 16 mL, Rfl: 2   pantoprazole (PROTONIX) 40 MG tablet, Take 1 tablet by mouth once daily, Disp: 90 tablet, Rfl: 0  No Known Allergies  ROS  Ten systems reviewed and is negative except as mentioned in HPI    Objective  Vitals:   08/18/23 0934  BP: 138/84  Pulse: 94  Resp: 16  SpO2: 98%  Weight: 185 lb 3.2 oz (84 kg)  Height: 6\' 3"  (1.905 m)    Body mass index is 23.15 kg/m.    Physical Exam  Constitutional: Patient appears well-developed and well-nourished.  No distress.  HEENT: head atraumatic, normocephalic, pupils equal and reactive to light, neck supple Cardiovascular: Normal rate, regular rhythm and normal heart sounds.  No murmur heard. No BLE edema. Pulmonary/Chest: Effort normal and breath sounds normal. No respiratory distress. Abdominal: Soft.  There is no tenderness. Psychiatric: Patient has a normal mood and affect. behavior is normal. Judgment and thought content normal.   Recent Results (from the past 2160 hours)  Surgical pathology     Status: None   Collection Time: 06/25/23 12:00 AM  Result Value Ref Range   SURGICAL PATHOLOGY      SURGICAL PATHOLOGY [facility] [address] [place], [place] [address] Telephone [phone] or [phone] Fax [phone]  REPORT OF SURGICAL PATHOLOGY   Accession #: [id number] Patient Name: NORMAL, RECINOS Visit # : [id number]  MRN: [id number] Physician: Midge Minium DOB/Age 02/16/1976 (Age: 66) Gender: M Collected Date: 06/25/2023 Received Date: 06/25/2023  FINAL DIAGNOSIS       1. Hepatic Flexure Polyp, cold snare :       - HYPERPLASTIC POLYP.      - NEGATIVE FOR DYSPLASIA AND MALIGNANCY.       2. Sigmoid  Colon Polyp, cold snare :       - TUBULAR ADENOMA.      - NEGATIVE FOR HIGH-GRADE DYSPLASIA AND MALIGNANCY.       ELECTRONIC SIGNATURE : Rubinas Md, Delice Bison , Sports administrator, Electronic Signature  MICROSCOPIC DESCRIPTION  CASE COMMENTS STAINS USED IN  DIAGNOSIS: H&E H&E    CLINICAL HISTORY  SPECIMEN(S) OBTAINED 1. Hepatic Flexure Polyp, Cold Snare 2. Sigmoid  Colon Polyp, Cold Snare  SPECIMEN COMMENTS: SPE CIMEN CLINICAL INFORMATION: 1. screening colonscopy.Diverticulosis, colon polyp.    Gross Description 1. Received in formalin is a 1.0 x 1.0 x 0.1 cm aggregate of multiple tan polypoid tissue fragments submitted entirely in block 1A. 2. Received in formalin is a 0.6 x 0.5 x 0.2 cm aggregate of multiple polypoid tissue fragments, submitted entirely in block 2A.(SB:kh 06/25/23)        Report signed out from the following location(s) [place] [address], [place] [address] CLIA #: [id number]  [facility] [address] [place], [place] [address] CLIA #: [id number]    Flowsheet Row [facility]/20[facility]  PHQ-9 Total Score 21        Assessment and Plan    Major Depressive Disorder High stress at work and home, including potential separation from spouse and toxic work environment. PHQ-9 score of 21 indicating severe depression. Currently seeing a therapist, Sandie Ano, through work. No history of depression or suicidal ideation, but reported one instance of passive suicidal ideation ("maybe would it be better if I wasn't here"). -Start Lexapro 10mg  daily for depression and anxiety. -Continue weekly therapy with Sandie Ano. -Return to work on March 8th, 2025. -Follow-up in 6 weeks to assess response to treatment.  Attention Deficit Hyperactivity Disorder (ADHD) Currently managed on Adderall. -Continue current treatment regimen.

## 2023-08-30 ENCOUNTER — Encounter: Payer: Self-pay | Admitting: Family Medicine

## 2023-09-18 ENCOUNTER — Ambulatory Visit (INDEPENDENT_AMBULATORY_CARE_PROVIDER_SITE_OTHER): Admitting: Family Medicine

## 2023-09-18 ENCOUNTER — Encounter: Payer: Self-pay | Admitting: Family Medicine

## 2023-09-18 VITALS — BP 124/86 | HR 90 | Resp 16 | Ht 75.0 in | Wt 186.9 lb

## 2023-09-18 DIAGNOSIS — F419 Anxiety disorder, unspecified: Secondary | ICD-10-CM

## 2023-09-18 DIAGNOSIS — F5105 Insomnia due to other mental disorder: Secondary | ICD-10-CM | POA: Diagnosis not present

## 2023-09-18 DIAGNOSIS — F341 Dysthymic disorder: Secondary | ICD-10-CM | POA: Diagnosis not present

## 2023-09-18 MED ORDER — SERTRALINE HCL 50 MG PO TABS: 50.0000 mg | ORAL_TABLET | Freq: Every day | ORAL | 1 refills | Status: DC

## 2023-09-18 MED ORDER — TRAZODONE HCL 50 MG PO TABS: 25.0000 mg | ORAL_TABLET | Freq: Every evening | ORAL | 1 refills | Status: DC | PRN

## 2023-09-18 NOTE — Progress Notes (Signed)
 Name: Victor Jackson   MRN: [id number]    DOB: 01-19-1976   Date:09/18/2023       Progress Note  Subjective  Chief Complaint  Chief Complaint  Patient presents with   Depression   Anxiety    New medication doing well   Discussed the use of AI scribe software for clinical note transcription with the patient, who gave verbal consent to proceed.  History of Present Illness [name] is a 48 year old male who presents for a follow-up regarding stress management and medication review.  He has been experiencing significant stress due to a previously toxic work environment and personal issues, including separation from his wife  He took a leave from work from February 25th to March 8th and has since returned, finding the environment less stressful. 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  He was prescribed Lexapro 10 mg a month ago but discontinued it a week ago due to side effects, including frequent urination and erectile dysfunction, which resolved after stopping the medication. He is attending therapy with Philmore Pali.  He experiences occasional sleep disturbances, characterized by difficulty falling asleep due to a busy mind. He has previously used trazodone for sleep, which he found helpful.  He is currently separated from his  wife  and has moved into an apartment. He maintains regular communication with his partner for the sake of their children, whom he sees almost daily unless work commitments interfere. He strives to maintain a normal routine for the children, picking them up from school when possible.  He has a history of using Adderall and has a follow-up appointment in May for refills.    Patient Active Problem List   Diagnosis Date Noted   Encounter for screening colonoscopy 06/25/2023   Polyp of sigmoid colon 06/25/2023   Pre-diabetes 08/19/2019   Dyslipidemia 08/19/2019   Essential hypertension, benign  08/19/2019   Pes planus 09/11/2016   Controlled gout 05/02/2016   Umbilical hernia 11/27/2015   GERD (gastroesophageal reflux disease) 11/27/2015   Thrombocytopenia (HCC) 11/27/2015   Vitamin D deficiency 11/27/2015   Hypertension, benign 11/27/2015   ADD (attention deficit disorder) 11/27/2015   History of hand fracture     Past Surgical History:  Procedure Laterality Date   COLONOSCOPY WITH PROPOFOL N/A 06/25/2023   Procedure: COLONOSCOPY WITH PROPOFOL;  Surgeon: Midge Minium, MD;  Location: [facility] ENDOSCOPY;  Service: Endoscopy;  Laterality: N/A;   KNEE ARTHROSCOPY Left 1998   none     POLYPECTOMY  06/25/2023   Procedure: POLYPECTOMY;  Surgeon: Midge Minium, MD;  Location: [facility] ENDOSCOPY;  Service: Endoscopy;;    Family History  Problem Relation Age of Onset   Arthritis Mother    Diabetes Mother    Hypertension Mother    COPD Father    Hypertension Father     Social History   Tobacco Use   Smoking status: Former    Current packs/day: 0.00    Types: Cigars, Cigarettes    Quit date: 06/28/2013    Years since quitting: 10.2   Smokeless tobacco: Never  Substance Use Topics   Alcohol use: Yes    Alcohol/week: 1.0 standard drink of alcohol    Types: 1 Cans of beer per week    Comment: rarely     Current Outpatient Medications:    allopurinol (ZYLOPRIM) 100 MG tablet, Take 1 tablet (100 mg total) by mouth daily., Disp: 90 tablet, Rfl: 1   amLODipine-valsartan (  EXFORGE) 5-320 MG tablet, Take 1 tablet by mouth daily., Disp: 90 tablet, Rfl: 0   amphetamine-dextroamphetamine (ADDERALL XR) 20 MG 24 hr capsule, Take 1 capsule (20 mg total) by mouth every morning., Disp: 30 capsule, Rfl: 0   amphetamine-dextroamphetamine (ADDERALL XR) 20 MG 24 hr capsule, Take 1 capsule (20 mg total) by mouth every morning., Disp: 30 capsule, Rfl: 0   amphetamine-dextroamphetamine (ADDERALL XR) 20 MG 24 hr capsule, Take 1 capsule (20 mg total) by mouth every morning., Disp: 30 capsule, Rfl: 0    Azelastine HCl 137 MCG/SPRAY SOLN, Place 2 sprays into the nose every morning., Disp: 30 mL, Rfl: 1   Cholecalciferol (VITAMIN D) 50 MCG (2000 UT) CAPS, Take 1 capsule by mouth daily at 12 noon., Disp: , Rfl:    escitalopram (LEXAPRO) 10 MG tablet, Take 1 tablet (10 mg total) by mouth daily., Disp: 30 tablet, Rfl: 1   fluticasone (FLONASE) 50 MCG/ACT nasal spray, Place 1 spray into both nostrils every evening., Disp: 16 mL, Rfl: 2   pantoprazole (PROTONIX) 40 MG tablet, Take 1 tablet by mouth once daily, Disp: 90 tablet, Rfl: 0  No Known Allergies  I personally reviewed active problem list, medication list, allergies, family history with the patient/caregiver today.   ROS  Ten systems reviewed and is negative except as mentioned in HPI    Objective Physical Exam Constitutional: Patient appears well-developed and well-nourished.  No distress.  HEENT: head atraumatic, normocephalic, pupils equal and reactive to light, neck supple Cardiovascular: Normal rate, regular rhythm and normal heart sounds.  No murmur heard. No BLE edema. Pulmonary/Chest: Effort normal and breath sounds normal. No respiratory distress. Abdominal: Soft.  There is no tenderness. Psychiatric: Patient has a normal mood and affect. behavior is normal. Judgment and thought content normal.   Vitals:   09/18/23 0953  BP: 124/86  Pulse: 90  Resp: 16  SpO2: 98%  Weight: 186 lb 14.4 oz (84.8 kg)  Height: 6\' 3"  (1.905 m)    Body mass index is 23.36 kg/m.  Recent Results (from the past 2160 hours)  Surgical pathology     Status: None   Collection Time: 06/25/23 12:00 AM  Result Value Ref Range   SURGICAL PATHOLOGY      SURGICAL PATHOLOGY [facility] [address], [address] [place], [place] [address] Telephone [phone] or [phone] Fax [phone]  REPORT OF SURGICAL PATHOLOGY   Accession #: [id number] Patient Name: [name], [name] Visit # : [id number]  MRN:  [id number] Physician: Midge Minium DOB/Age [birth year] (Age: 51) Gender: M Collected Date: 06/25/2023 Received Date: 06/25/2023  FINAL DIAGNOSIS       1. Hepatic Flexure Polyp, cold snare :       - HYPERPLASTIC POLYP.      - NEGATIVE FOR DYSPLASIA AND MALIGNANCY.       2. Sigmoid  Colon Polyp, cold snare :       - TUBULAR ADENOMA.      - NEGATIVE FOR HIGH-GRADE DYSPLASIA AND MALIGNANCY.       ELECTRONIC SIGNATURE : Rubinas Md, Delice Bison , Sports administrator, Electronic Signature  MICROSCOPIC DESCRIPTION  CASE COMMENTS STAINS USED IN DIAGNOSIS: H&E H&E    CLINICAL HISTORY  SPECIMEN(S) OBTAINED 1. Hepatic Flexure Polyp, Cold Snare 2. Sigmoid  Colon Polyp, Cold Snare  SPECIMEN COMMENTS: SPE CIMEN CLINICAL INFORMATION: 1. screening colonscopy.Diverticulosis, colon polyp.    Gross Description 1. Received in formalin is a 1.0 x 1.0 x 0.1 cm aggregate of multiple tan  polypoid tissue fragments submitted entirely in block 1A. 2. Received in formalin is a 0.6 x 0.5 x 0.2 cm aggregate of multiple polypoid tissue fragments, submitted entirely in block 2A.(SB:kh 06/25/23)        Report signed out from the following location(s) [facility] [address], [place] [address] CLIA #: [id number]  [facility] [address] [place], [place] [address] CLIA #: [id number]     Diabetic Foot Exam:     PHQ2/9:    09/18/2023    9:51 AM 08/18/2023    9:32 AM 08/04/2023   10:19 AM 04/30/2023   10:21 AM 01/26/2023    9:40 AM  Depression screen PHQ 2/9  Decreased Interest 1 3 0 0 0  Down, Depressed, Hopeless 1 3 0 0 0  PHQ - 2 Score 2 6 0 0 0  Altered sleeping 1 3 0 0 0  Tired, decreased energy 0 3 0 0 0  Change in appetite 0 3 0 0 0  Feeling bad or failure about yourself  0 2 0 0 0  Trouble concentrating 1 3 0 0 0  Moving slowly or fidgety/restless 0 0 0 0 0  Suicidal thoughts 0 1 0 0 0  PHQ-9 Score 4 21 0 0 0  Difficult doing work/chores Not  difficult at all Extremely dIfficult Not difficult at all      phq 9 is positive  Fall Risk:    08/04/2023   10:18 AM 04/30/2023   10:21 AM 01/26/2023    9:40 AM 11/20/2022    9:53 AM 10/28/2022    1:37 PM  Fall Risk   Falls in the past year? 0 0 0 0 0  Number falls in past yr: 0  0 0 0  Injury with Fall? 0  0 0 0  Risk for fall due to : No Fall Risks No Fall Risks No Fall Risks No Fall Risks No Fall Risks  Follow up Falls prevention discussed;Education provided;Falls evaluation completed Falls prevention discussed Falls prevention discussed Falls prevention discussed Falls prevention discussed     Assessment & Plan Situational Stress/dysthymia Stress due to work and personal issues. Lexapro discontinued due to side effects. PHQ-9 score improved significantly. Discussed Zoloft as alternative. - Prescribe Zoloft 50 mg, start with half a pill for a week, then one pill daily. - Continue therapy sessions. - Monitor for side effects and effectiveness of Zoloft. - Follow up in one month.  Insomnia Intermittent sleep disturbances related to stress. - Prescribe trazodone as needed for sleep, one hour before bedtime.  Attention Deficit Disorder (ADD) ADD managed with medication. - Ensure Adderall prescription is up to date. - Schedule follow-up appointment for ADD medication refills on May 19th.

## 2023-09-29 ENCOUNTER — Other Ambulatory Visit: Payer: Self-pay | Admitting: Family Medicine

## 2023-09-29 DIAGNOSIS — I10 Essential (primary) hypertension: Secondary | ICD-10-CM

## 2023-10-05 ENCOUNTER — Other Ambulatory Visit: Payer: Self-pay | Admitting: Orthopedic Surgery

## 2023-10-05 ENCOUNTER — Encounter: Payer: Self-pay | Admitting: Orthopedic Surgery

## 2023-10-05 DIAGNOSIS — S46211A Strain of muscle, fascia and tendon of other parts of biceps, right arm, initial encounter: Secondary | ICD-10-CM

## 2023-10-05 DIAGNOSIS — M25521 Pain in right elbow: Secondary | ICD-10-CM

## 2023-10-06 ENCOUNTER — Ambulatory Visit
Admission: RE | Admit: 2023-10-06 | Discharge: 2023-10-06 | Disposition: A | Discharge status: Home / Self Care | Source: Ambulatory Visit | Attending: Orthopedic Surgery

## 2023-10-06 DIAGNOSIS — M25521 Pain in right elbow: Secondary | ICD-10-CM

## 2023-10-06 DIAGNOSIS — S46211A Strain of muscle, fascia and tendon of other parts of biceps, right arm, initial encounter: Secondary | ICD-10-CM

## 2023-10-29 ENCOUNTER — Other Ambulatory Visit: Payer: Self-pay | Admitting: Family Medicine

## 2023-10-29 DIAGNOSIS — K219 Gastro-esophageal reflux disease without esophagitis: Secondary | ICD-10-CM

## 2023-10-29 MED ORDER — PANTOPRAZOLE SODIUM 40 MG PO TBEC: 40.0000 mg | DELAYED_RELEASE_TABLET | Freq: Every day | ORAL | 0 refills | Status: DC

## 2023-10-29 NOTE — Addendum Note (Signed)
 Addended by: Arleen Lacer F on: 10/29/2023 01:40 PM   Modules accepted: Orders

## 2023-11-02 ENCOUNTER — Ambulatory Visit (INDEPENDENT_AMBULATORY_CARE_PROVIDER_SITE_OTHER): Payer: BC Managed Care – PPO | Admitting: Family Medicine

## 2023-11-02 ENCOUNTER — Encounter: Payer: Self-pay | Admitting: Family Medicine

## 2023-11-02 VITALS — BP 136/86 | HR 78 | Resp 16 | Ht 75.0 in | Wt 196.9 lb

## 2023-11-02 DIAGNOSIS — E790 Hyperuricemia without signs of inflammatory arthritis and tophaceous disease: Secondary | ICD-10-CM

## 2023-11-02 DIAGNOSIS — I10 Essential (primary) hypertension: Secondary | ICD-10-CM

## 2023-11-02 DIAGNOSIS — F419 Anxiety disorder, unspecified: Secondary | ICD-10-CM

## 2023-11-02 DIAGNOSIS — E559 Vitamin D deficiency, unspecified: Secondary | ICD-10-CM

## 2023-11-02 DIAGNOSIS — E785 Hyperlipidemia, unspecified: Secondary | ICD-10-CM

## 2023-11-02 DIAGNOSIS — M109 Gout, unspecified: Secondary | ICD-10-CM

## 2023-11-02 DIAGNOSIS — R7303 Prediabetes: Secondary | ICD-10-CM

## 2023-11-02 DIAGNOSIS — K219 Gastro-esophageal reflux disease without esophagitis: Secondary | ICD-10-CM

## 2023-11-02 DIAGNOSIS — F341 Dysthymic disorder: Secondary | ICD-10-CM

## 2023-11-02 DIAGNOSIS — F902 Attention-deficit hyperactivity disorder, combined type: Secondary | ICD-10-CM

## 2023-11-02 MED ORDER — ALLOPURINOL 100 MG PO TABS: 100.0000 mg | ORAL_TABLET | Freq: Every day | ORAL | 5 refills | Status: DC

## 2023-11-02 MED ORDER — AMLODIPINE BESYLATE-VALSARTAN 5-320 MG PO TABS: 1.0000 | ORAL_TABLET | Freq: Every day | ORAL | 5 refills | Status: DC

## 2023-11-02 MED ORDER — AMPHETAMINE-DEXTROAMPHET ER 20 MG PO CP24: 20.0000 mg | ORAL_CAPSULE | ORAL | 0 refills | Status: DC

## 2023-11-02 MED ORDER — PANTOPRAZOLE SODIUM 40 MG PO TBEC: 40.0000 mg | DELAYED_RELEASE_TABLET | Freq: Every day | ORAL | 5 refills | Status: DC

## 2023-11-02 NOTE — Progress Notes (Signed)
 Name: Victor Jackson   MRN: [id number]    DOB: Jul 01, 1975   Date:11/02/2023       Progress Note  Subjective  Chief Complaint  Chief Complaint  Patient presents with   Medical Management of Chronic Issues   Discussed the use of AI scribe software for clinical note transcription with the patient, who gave verbal consent to proceed.  History of Present Illness Victor Jackson is a 48 year old male who presents for a three-month follow-up and to discuss life changes.  He has a history of attention deficit disorder (ADD) and hypertension. He is currently taking Adderall 20 mg XR  for ADD, which he finds helpful for focus, although he has not taken it since being fired from [employer]. He is due for a refill today. He has stopped taking sertraline  and trazodone , which were previously prescribed for depression, anxiety, and sleep. He reports sleeping well without these medications and feels emotionally better since leaving his job.  He has a history of hypertension and is currently taking amlodipine  and valsartan  5/320 mg . No chest pain, palpitations, or shortness of breath are reported.   He is also taking pantoprazole  for reflux, which is well-controlled with no significant issues. He is on allopurinol  for uric acid management with no reported issues.  Socially, he has experienced significant changes, including separation from his spouse and moving out of his house. He is currently unemployed after being let go from his job at [employer], which he describes as a stressful environment. He is not actively seeking employment but has applied for a few management positions. He does not have health insurance at the moment and is exploring options for medication affordability through [facility]. His weight has increased to 196 pounds, which he attributes to reduced stress since leaving his job. Stress typically causes him to lose weight.    Patient Active Problem List   Diagnosis Date Noted   Encounter for screening  colonoscopy 06/25/2023   Polyp of sigmoid colon 06/25/2023   Pre-diabetes 08/19/2019   Dyslipidemia 08/19/2019   Essential hypertension, benign 08/19/2019   Pes planus 09/11/2016   Controlled gout 05/02/2016   Umbilical hernia 11/27/2015   GERD (gastroesophageal reflux disease) 11/27/2015   Thrombocytopenia (HCC) 11/27/2015   Vitamin D  deficiency 11/27/2015   Hypertension, benign 11/27/2015   ADD (attention deficit disorder) 11/27/2015   History of hand fracture     Past Surgical History:  Procedure Laterality Date   COLONOSCOPY WITH PROPOFOL  N/A 06/25/2023   Procedure: COLONOSCOPY WITH PROPOFOL ;  Surgeon: Marnee Sink, MD;  Location: [facility] ENDOSCOPY;  Service: Endoscopy;  Laterality: N/A;   KNEE ARTHROSCOPY Left 1998   none     POLYPECTOMY  06/25/2023   Procedure: POLYPECTOMY;  Surgeon: Marnee Sink, MD;  Location: [facility] ENDOSCOPY;  Service: Endoscopy;;    Family History  Problem Relation Age of Onset   Arthritis Mother    Diabetes Mother    Hypertension Mother    COPD Father    Hypertension Father     Social History   Tobacco Use   Smoking status: Former    Current packs/day: 0.00    Types: Cigars, Cigarettes    Quit date: 06/28/2013    Years since quitting: 10.3   Smokeless tobacco: Never  Substance Use Topics   Alcohol use: Yes    Alcohol/week: 1.0 standard drink of alcohol    Types: 1 Cans of beer per week    Comment: rarely     Current Outpatient Medications:  allopurinol  (ZYLOPRIM ) 100 MG tablet, Take 1 tablet (100 mg total) by mouth daily., Disp: 90 tablet, Rfl: 1   amLODipine -valsartan  (EXFORGE ) 5-320 MG tablet, Take 1 tablet by mouth daily., Disp: 90 tablet, Rfl: 0   amphetamine -dextroamphetamine (ADDERALL XR) 20 MG 24 hr capsule, Take 1 capsule (20 mg total) by mouth every morning., Disp: 30 capsule, Rfl: 0   amphetamine -dextroamphetamine (ADDERALL XR) 20 MG 24 hr capsule, Take 1 capsule (20 mg total) by mouth every morning., Disp: 30 capsule, Rfl: 0    amphetamine -dextroamphetamine (ADDERALL XR) 20 MG 24 hr capsule, Take 1 capsule (20 mg total) by mouth every morning., Disp: 30 capsule, Rfl: 0   Azelastine  HCl 137 MCG/SPRAY SOLN, Place 2 sprays into the nose every morning., Disp: 30 mL, Rfl: 1   Cholecalciferol (VITAMIN D ) 50 MCG (2000 UT) CAPS, Take 1 capsule by mouth daily at 12 noon., Disp: , Rfl:    fluticasone  (FLONASE ) 50 MCG/ACT nasal spray, Place 1 spray into both nostrils every evening., Disp: 16 mL, Rfl: 2   pantoprazole  (PROTONIX ) 40 MG tablet, Take 1 tablet (40 mg total) by mouth daily., Disp: 90 tablet, Rfl: 0   sertraline  (ZOLOFT ) 50 MG tablet, Take 1 tablet (50 mg total) by mouth daily., Disp: 30 tablet, Rfl: 1   traZODone  (DESYREL ) 50 MG tablet, Take 0.5-1 tablets (25-50 mg total) by mouth at bedtime as needed for sleep., Disp: 30 tablet, Rfl: 1  No Known Allergies  I personally reviewed active problem list, medication list, allergies with the patient/caregiver today.   ROS  Ten systems reviewed and is negative except as mentioned in HPI    Objective Physical Exam Constitutional: Patient appears well-developed and well-nourished.  No distress.  HEENT: head atraumatic, normocephalic, pupils equal and reactive to light, neck supple Cardiovascular: Normal rate, regular rhythm and normal heart sounds.  No murmur heard. No BLE edema. Pulmonary/Chest: Effort normal and breath sounds normal. No respiratory distress. Abdominal: Soft.  There is no tenderness. Psychiatric: Patient has a normal mood and affect. behavior is normal. Judgment and thought content normal.   Vitals:   11/02/23 1049  BP: 136/86  Pulse: 78  Resp: 16  SpO2: 98%  Weight: 196 lb 14.4 oz (89.3 kg)  Height: 6\' 3"  (1.905 m)    Body mass index is 24.61 kg/m.    PHQ2/9:    11/02/2023   10:49 AM 09/18/2023    9:51 AM 08/18/2023    9:32 AM 08/04/2023   10:19 AM 04/30/2023   10:21 AM  Depression screen PHQ 2/9  Decreased Interest 0 1 3 0 0  Down,  Depressed, Hopeless 0 1 3 0 0  PHQ - 2 Score 0 2 6 0 0  Altered sleeping 0 1 3 0 0  Tired, decreased energy 0 0 3 0 0  Change in appetite 0 0 3 0 0  Feeling bad or failure about yourself  0 0 2 0 0  Trouble concentrating 0 1 3 0 0  Moving slowly or fidgety/restless 0 0 0 0 0  Suicidal thoughts 0 0 1 0 0  PHQ-9 Score 0 4 21 0 0  Difficult doing work/chores Not difficult at all Not difficult at all Extremely dIfficult Not difficult at all     phq 9 is negative  Fall Risk:    11/02/2023   10:45 AM 08/04/2023   10:18 AM 04/30/2023   10:21 AM 01/26/2023    9:40 AM 11/20/2022    9:53 AM  Fall Risk  Falls in the past year? 0 0 0 0 0  Number falls in past yr: 0 0  0 0  Injury with Fall? 0 0  0 0  Risk for fall due to : No Fall Risks No Fall Risks No Fall Risks No Fall Risks No Fall Risks  Follow up Falls prevention discussed;Education provided;Falls evaluation completed Falls prevention discussed;Education provided;Falls evaluation completed Falls prevention discussed Falls prevention discussed Falls prevention discussed      Assessment & Plan Hypertension Blood pressure controlled at 136/86 mmHg with amlodipine  and valsartan . - Prescribe amlodipine  and valsartan  (Exforge ) for 30 days with refills for 6 months. - Advise use of [facility] for cost-effective pricing. - Send prescription to [facility].  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 - Prescribe allopurinol  with a 30-day supply. - Advise use of [facility] for cost-effective pricing. - Send prescription to [facility].  Gastroesophageal reflux disease (GERD) GERD well-managed with pantoprazole , no significant issues reported. - Prescribe pantoprazole  with refills for 6 months. - Advise use of [facility] for cost-effective pricing. - Send prescription to [facility].  Attention-deficit hyperactivity disorder  (ADHD) Managed with Adderall XR 20 mg, improved focus when used. Non-transferable between pharmacies. - Prescribe Adderall XR 20 mg with a 30-day supply. - Advise use of [facility] for cost-effective pricing. - Send prescription to [facility]. - Instruct to return for follow-up when running low on Adderall or if starting a new job.

## 2023-11-02 NOTE — Progress Notes (Signed)
 error

## 2023-11-10 ENCOUNTER — Other Ambulatory Visit: Payer: Self-pay | Admitting: Family Medicine

## 2023-11-10 DIAGNOSIS — F5105 Insomnia due to other mental disorder: Secondary | ICD-10-CM

## 2023-11-10 DIAGNOSIS — F419 Anxiety disorder, unspecified: Secondary | ICD-10-CM

## 2023-11-10 DIAGNOSIS — F341 Dysthymic disorder: Secondary | ICD-10-CM

## 2023-12-31 ENCOUNTER — Other Ambulatory Visit: Payer: Self-pay | Admitting: Family Medicine

## 2023-12-31 DIAGNOSIS — E790 Hyperuricemia without signs of inflammatory arthritis and tophaceous disease: Secondary | ICD-10-CM

## 2024-02-24 ENCOUNTER — Encounter: Payer: Self-pay | Admitting: Family Medicine

## 2024-02-24 ENCOUNTER — Other Ambulatory Visit: Payer: Self-pay | Admitting: Family Medicine

## 2024-02-24 DIAGNOSIS — F902 Attention-deficit hyperactivity disorder, combined type: Secondary | ICD-10-CM

## 2024-02-24 MED ORDER — AMPHETAMINE-DEXTROAMPHET ER 20 MG PO CP24: 20.0000 mg | ORAL_CAPSULE | ORAL | 0 refills | Status: DC

## 2024-03-10 ENCOUNTER — Other Ambulatory Visit: Payer: Self-pay | Admitting: Family Medicine

## 2024-03-10 DIAGNOSIS — K219 Gastro-esophageal reflux disease without esophagitis: Secondary | ICD-10-CM

## 2024-03-30 ENCOUNTER — Encounter: Payer: Self-pay | Admitting: Family Medicine

## 2024-03-31 ENCOUNTER — Other Ambulatory Visit: Payer: Self-pay | Admitting: Family Medicine

## 2024-03-31 DIAGNOSIS — F902 Attention-deficit hyperactivity disorder, combined type: Secondary | ICD-10-CM

## 2024-03-31 MED ORDER — AMPHETAMINE-DEXTROAMPHET ER 20 MG PO CP24
20.0000 mg | ORAL_CAPSULE | ORAL | 0 refills | Status: DC
Start: 2024-03-31 — End: 2024-05-04

## 2024-05-04 ENCOUNTER — Encounter: Payer: Self-pay | Admitting: Family Medicine

## 2024-05-04 ENCOUNTER — Ambulatory Visit: Payer: Self-pay | Admitting: Family Medicine

## 2024-05-04 VITALS — BP 128/72 | HR 76 | Resp 16 | Ht 75.0 in | Wt 197.4 lb

## 2024-05-04 DIAGNOSIS — E785 Hyperlipidemia, unspecified: Secondary | ICD-10-CM

## 2024-05-04 DIAGNOSIS — K219 Gastro-esophageal reflux disease without esophagitis: Secondary | ICD-10-CM

## 2024-05-04 DIAGNOSIS — I10 Essential (primary) hypertension: Secondary | ICD-10-CM

## 2024-05-04 DIAGNOSIS — E790 Hyperuricemia without signs of inflammatory arthritis and tophaceous disease: Secondary | ICD-10-CM

## 2024-05-04 DIAGNOSIS — M109 Gout, unspecified: Secondary | ICD-10-CM

## 2024-05-04 DIAGNOSIS — F902 Attention-deficit hyperactivity disorder, combined type: Secondary | ICD-10-CM

## 2024-05-04 DIAGNOSIS — E559 Vitamin D deficiency, unspecified: Secondary | ICD-10-CM

## 2024-05-04 DIAGNOSIS — R7303 Prediabetes: Secondary | ICD-10-CM

## 2024-05-04 DIAGNOSIS — D696 Thrombocytopenia, unspecified: Secondary | ICD-10-CM

## 2024-05-04 MED ORDER — AMPHETAMINE-DEXTROAMPHET ER 20 MG PO CP24: 20.0000 mg | ORAL_CAPSULE | ORAL | 0 refills | Status: AC

## 2024-05-04 MED ORDER — PANTOPRAZOLE SODIUM 40 MG PO TBEC: 40.0000 mg | DELAYED_RELEASE_TABLET | Freq: Every day | ORAL | 5 refills | Status: AC

## 2024-05-04 MED ORDER — AMLODIPINE BESYLATE-VALSARTAN 5-320 MG PO TABS
1.0000 | ORAL_TABLET | Freq: Every day | ORAL | 5 refills | Status: AC
Start: 2024-05-04 — End: ?

## 2024-05-04 MED ORDER — ALLOPURINOL 100 MG PO TABS: 100.0000 mg | ORAL_TABLET | Freq: Every day | ORAL | 5 refills | Status: AC

## 2024-05-04 NOTE — Progress Notes (Signed)
 Name: Victor Jackson   MRN: [id number]    DOB: Dec 01, 1975   Date:05/04/2024       Progress Note  Subjective  Chief Complaint  Chief Complaint  Patient presents with   Medical Management of Chronic Issues   Discussed the use of AI scribe software for clinical note transcription with the patient, who gave verbal consent to proceed.  History of Present Illness Victor Jackson is a 48 year old male who presents for a routine follow-up visit.  He has hypertension managed with amlodipine  and valsartan  5/320 mg daily. No issues with the medication and no symptoms such as chest pain, palpitations, dizziness, or lightheadedness. He has not been checking his blood pressure at home.  For ADD, he takes Adderall XR 20 mg in the mornings. Insurance now covers the medication, allowing monthly pickups instead of mail order. Previously experienced a shortage due to insurance requirements, but this has been resolved.  He has GERD and takes pantoprazole . Going without the medication for two to three days is difficult. He uses over-the-counter options when necessary.  He manages dyslipidemia with dietary changes rather than medication. He also has gout, taking allopurinol  100 mg daily. Uric acid levels have been stable.  He has a history of prediabetes, but A1c levels have normalized. No symptoms such as increased hunger, thirst, or frequent urination. Last blood work was in November of the previous year, and he is due for updated labs to monitor kidney function and platelet levels, which have been low in the past.  He takes over-the-counter vitamin D  supplements due to previously low levels and prefers to continue this regimen without further testing at this time.  He uses azelastine  nasal spray as needed and reports no current need for a refill.    Patient Active Problem List   Diagnosis Date Noted   Encounter for screening colonoscopy 06/25/2023   Polyp of sigmoid colon 06/25/2023   Pre-diabetes  08/19/2019   Dyslipidemia 08/19/2019   Essential hypertension, benign 08/19/2019   Pes planus 09/11/2016   Controlled gout 05/02/2016   Umbilical hernia 11/27/2015   GERD (gastroesophageal reflux disease) 11/27/2015   Thrombocytopenia 11/27/2015   Vitamin D  deficiency 11/27/2015   Hypertension, benign 11/27/2015   ADD (attention deficit disorder) 11/27/2015   History of hand fracture     Past Surgical History:  Procedure Laterality Date   COLONOSCOPY WITH PROPOFOL  N/A 06/25/2023   Procedure: COLONOSCOPY WITH PROPOFOL ;  Surgeon: Jinny Carmine, MD;  Location: [facility] END[facility]COPY;  Service: Endoscopy;  Laterality: N/A;   KNEE ARTHROSCOPY Left 1998   none     POLYPECTOMY  06/25/2023   Procedure: POLYPECTOMY;  Surgeon: Jinny Carmine, MD;  Location: [facility] ENDOSCOPY;  Service: Endoscopy;;    Family History  Problem Relation Age of Onset   Arthritis Mother    Diabetes Mother    Hypertension Mother    COPD Father    Hypertension Father     Social History   Tobacco Use   Smoking status: Former    Current packs/day: 0.00    Types: Cigars, Cigarettes    Quit date: 06/28/2013    Years since quitting: 10.8   Smokeless tobacco: Never  Substance Use Topics   Alcohol use: Yes    Alcohol/week: 1.0 standard drink of alcohol    Types: 1 Cans of beer per week    Comment: rarely     Current Outpatient Medications:    Azelastine  HCl 137 MCG/SPRAY SOLN, Place 2 sprays into the nose every  morning., Disp: 30 mL, Rfl: 1   Cholecalciferol (VITAMIN D ) 50 MCG (2000 UT) CAPS, Take 1 capsule by mouth daily at 12 noon., Disp: , Rfl:    fluticasone  (FLONASE ) 50 MCG/ACT nasal spray, Place 1 spray into both nostrils every evening., Disp: 16 mL, Rfl: 2   allopurinol  (ZYLOPRIM ) 100 MG tablet, Take 1 tablet (100 mg total) by mouth daily., Disp: 30 tablet, Rfl: 5   amLODipine -valsartan  (EXFORGE ) 5-320 MG tablet, Take 1 tablet by mouth daily., Disp: 30 tablet, Rfl: 5   amphetamine -dextroamphetamine (ADDERALL XR)  20 MG 24 hr capsule, Take 1 capsule (20 mg total) by mouth every morning., Disp: 30 capsule, Rfl: 0   amphetamine -dextroamphetamine (ADDERALL XR) 20 MG 24 hr capsule, Take 1 capsule (20 mg total) by mouth every morning., Disp: 30 capsule, Rfl: 0   amphetamine -dextroamphetamine (ADDERALL XR) 20 MG 24 hr capsule, Take 1 capsule (20 mg total) by mouth every morning., Disp: 30 capsule, Rfl: 0   pantoprazole  (PROTONIX ) 40 MG tablet, Take 1 tablet (40 mg total) by mouth daily., Disp: 30 tablet, Rfl: 5  No Known Allergies  I personally reviewed active problem list, medication list, allergies with the patient/caregiver today.   ROS  Ten systems reviewed and is negative except as mentioned in HPI    Objective Physical Exam  CONSTITUTIONAL: Patient appears well-developed and well-nourished.  No distress. HEENT: Head atraumatic, normocephalic, neck supple. CARDIOVASCULAR: Normal rate, regular rhythm and normal heart sounds.  No murmur heard. No BLE edema. PULMONARY: Effort normal and breath sounds normal. No respiratory distress. ABDOMINAL: There is no tenderness or distention. MUSCULOSKELETAL: Normal gait. Without gross motor or sensory deficit. PSYCHIATRIC: Patient has a normal mood and affect. behavior is normal. Judgment and thought content normal.  Vitals:   05/04/24 1409  BP: 128/72  Pulse: 76  Resp: 16  SpO2: 98%  Weight: 197 lb 6.4 oz (89.5 kg)  Height: 6' 3 (1.905 m)    Body mass index is 24.67 kg/m.  No results found for this or any previous visit (from the past 2160 hours).  Diabetic Foot Exam:     PHQ2/9:    05/04/2024    2:07 PM 11/02/2023   10:49 AM 09/18/2023    9:51 AM 08/18/2023    9:32 AM 08/04/2023   10:19 AM  Depression screen PHQ 2/9  Decreased Interest 0 0 1 3 0  Down, Depressed, Hopeless 0 0 1 3 0  PHQ - 2 Score 0 0 2 6 0  Altered sleeping 0 0 1 3 0  Tired, decreased energy 0 0 0 3 0  Change in appetite 0 0 0 3 0  Feeling bad or failure about yourself   0 0 0 2 0  Trouble concentrating 0 0 1 3 0  Moving slowly or fidgety/restless 0 0 0 0 0  Suicidal thoughts 0 0 0 1 0  PHQ-9 Score 0 0  4  21  0   Difficult doing work/chores Not difficult at all Not difficult at all Not difficult at all Extremely dIfficult Not difficult at all     Data saved with a previous flowsheet row definition    phq 9 is negative  Fall Risk:    05/04/2024    2:07 PM 11/02/2023   10:45 AM 08/04/2023   10:18 AM 04/30/2023   10:21 AM 01/26/2023    9:40 AM  Fall Risk   Falls in the past year? 0 0 0 0 0  Number falls in past yr:  0 0 0  0  Injury with Fall? 0 0 0  0  Risk for fall due to : No Fall Risks No Fall Risks No Fall Risks No Fall Risks No Fall Risks  Follow up Falls evaluation completed Falls prevention discussed;Education provided;Falls evaluation completed Falls prevention discussed;Education provided;Falls evaluation completed Falls prevention discussed Falls prevention discussed      Assessment & Plan Essential hypertension Blood pressure controlled at 128/72 mmHg. - Continue amlodipine  and valsartan  5/320 mg daily. - Encourage resumption of home blood pressure monitoring.  Attention-deficit hyperactivity disorder Managed with Adderall XR 20 mg daily. Insurance issues resolved. - Prescribed Adderall XR 20 mg for December 18th and January 17th. - Continue monthly prescription process.  Gastroesophageal reflux disease Symptoms worsen without pantoprazole . - Continue pantoprazole  daily. - Use over-the-counter medications as needed.  Gout and hyperuricemia Uric acid levels stable with allopurinol  100 mg daily. - Continue allopurinol  100 mg daily. - Ordered uric acid level for dose assessment.  Thrombocytopenia Platelet count normalized from 109 to 137. - Ordered complete blood count to monitor platelet levels.  Prediabetes A1c normalized. - Ordered A1c test to monitor glucose control.  Hyperlipidemia Managed with diet. - Continue  dietary management.  Vitamin D  deficiency Managed with over-the-counter supplementation. - Continue over-the-counter vitamin D  supplementation.  General Health Maintenance Colonoscopy up to date. Declined flu shot, plans to receive at age 60. - Schedule annual physical exam. - Consider flu vaccination at age 15.

## 2024-07-05 ENCOUNTER — Encounter: Payer: Self-pay | Admitting: Family Medicine

## 2024-07-05 NOTE — Patient Instructions (Incomplete)

## 2024-07-06 ENCOUNTER — Encounter: Admitting: Family Medicine

## 2024-08-08 ENCOUNTER — Ambulatory Visit: Admitting: Family Medicine

## 2024-09-09 ENCOUNTER — Encounter: Admitting: Family Medicine

## 2024-09-09 ENCOUNTER — Ambulatory Visit: Admitting: Family Medicine
